# Patient Record
Sex: Female | Born: 1960 | Race: White | Hispanic: No | Marital: Married | Smoking: Never smoker
Health system: Southern US, Community
[De-identification: ages and names within clinical notes are randomized; demographics above are authoritative.]

## PROBLEM LIST (undated history)

## (undated) DIAGNOSIS — R112 Nausea with vomiting, unspecified: Secondary | ICD-10-CM

## (undated) DIAGNOSIS — I10 Essential (primary) hypertension: Secondary | ICD-10-CM

## (undated) DIAGNOSIS — N189 Chronic kidney disease, unspecified: Secondary | ICD-10-CM

## (undated) DIAGNOSIS — Z87442 Personal history of urinary calculi: Secondary | ICD-10-CM

## (undated) DIAGNOSIS — D649 Anemia, unspecified: Secondary | ICD-10-CM

## (undated) DIAGNOSIS — Z9889 Other specified postprocedural states: Secondary | ICD-10-CM

## (undated) HISTORY — PX: ECTOPIC PREGNANCY SURGERY: SHX613

## (undated) HISTORY — PX: OTHER SURGICAL HISTORY: SHX169

## (undated) HISTORY — PX: CYSTOSCOPY: SUR368

## (undated) HISTORY — PX: NEPHROLITHOTOMY: SUR881

---

## 2000-02-15 ENCOUNTER — Encounter (INDEPENDENT_AMBULATORY_CARE_PROVIDER_SITE_OTHER): Payer: Self-pay

## 2000-02-15 ENCOUNTER — Ambulatory Visit (HOSPITAL_COMMUNITY): Admission: RE | Admit: 2000-02-15 | Discharge: 2000-02-15 | Payer: Self-pay | Admitting: General Surgery

## 2004-01-26 ENCOUNTER — Ambulatory Visit (HOSPITAL_COMMUNITY): Admission: RE | Admit: 2004-01-26 | Discharge: 2004-01-26 | Payer: Self-pay | Admitting: Family Medicine

## 2005-06-01 ENCOUNTER — Ambulatory Visit (HOSPITAL_COMMUNITY): Admission: RE | Admit: 2005-06-01 | Discharge: 2005-06-01 | Payer: Self-pay | Admitting: Family Medicine

## 2006-01-27 ENCOUNTER — Ambulatory Visit (HOSPITAL_COMMUNITY): Admission: RE | Admit: 2006-01-27 | Discharge: 2006-01-27 | Payer: Self-pay | Admitting: Urology

## 2006-02-06 ENCOUNTER — Ambulatory Visit (HOSPITAL_COMMUNITY): Admission: RE | Admit: 2006-02-06 | Discharge: 2006-02-06 | Payer: Self-pay | Admitting: Urology

## 2006-02-13 ENCOUNTER — Ambulatory Visit (HOSPITAL_COMMUNITY): Admission: RE | Admit: 2006-02-13 | Discharge: 2006-02-13 | Payer: Self-pay | Admitting: Urology

## 2007-09-17 ENCOUNTER — Ambulatory Visit (HOSPITAL_COMMUNITY): Admission: RE | Admit: 2007-09-17 | Discharge: 2007-09-17 | Payer: Self-pay | Admitting: Family Medicine

## 2007-09-26 ENCOUNTER — Ambulatory Visit (HOSPITAL_COMMUNITY): Admission: RE | Admit: 2007-09-26 | Discharge: 2007-09-26 | Payer: Self-pay | Admitting: Family Medicine

## 2011-03-18 NOTE — Op Note (Signed)
NAMECRISSA, SOWDER              ACCOUNT NO.:  [id number]   MEDICAL RECORD NO.:  [id number]          PATIENT TYPE:  AMB   LOCATION:  DAY                           FACILITY:  [facility]   PHYSICIAN:  Dennie Maizes, M.D.   DATE OF BIRTH:  05-21-1961   DATE OF PROCEDURE:  02/13/2006  DATE OF DISCHARGE:                                 OPERATIVE REPORT   PREOPERATIVE DIAGNOSES:  1.  Left distal ureteral calculus with obstruction.  2.  Left renal colic.   POSTOPERATIVE DIAGNOSES:  1.  Left distal ureteral calculus with obstruction.  2.  Left renal colic.   OPERATIVE PROCEDURE:  Cystoscopy, left retrograde pyelogram, left  ureteroscopic stone extraction and left ureteral stent placement.   ANESTHESIA:  General.   SURGEON:  Dennie Maizes, M.D.   COMPLICATIONS:  None.   ESTIMATED BLOOD LOSS:  Minimal.   DRAINS:  A 6 Furey 26 cm size left ureteral stent with a string.   SPECIMEN:  Left ureteral calculus, which was sent for chemical analysis.   COMPLICATIONS:  None.   INDICATIONS FOR PROCEDURE:  This 50 year old female had severe left flank  pain.  X-rays revealed a 4 mm size left distal ureteral calculus with  obstruction and mild hydronephrosis.  The patient was unable to pass the  stone.  She is taken to the OR today for cystoscopy, left retrograde  pyelogram, left ureteroscopic stone extraction and left ureteral stent  placement.   DESCRIPTION OF PROCEDURE:  General anesthesia was induced and the patient  was placed on the OR table in the dorsal lithotomy position.  The lower  abdomen and genitalia were prepped and draped in a sterile fashion.  Cystoscopy was done with the 25-Rosevear scope.  The appearance of the bladder  was normal.  The trigone, ureteral orifices and bladder mucosa were  unremarkable.  A 5-Abdelrahman wedge catheter was then placed in the left lateral  orifice.  Over 7 mL of Renografin 60 was injected to the collecting system  and a retrograde pyelogram was done  by using C-arm fluoroscopy.  There was a  small filling defect in the intramural ureter.  There was mild dilation of  ureter on the left side.   A 5-Malachi open-ended catheter was then placed in the left ureteral orifice.  A 0.138 inches Benson guidewire was inserted into the left collecting  system.  The distal ureter was then dilated using a 15-Escamilla balloon  dilating catheter.  The balloon dilating catheter was then removed leaving  the guidewire in place.  Ureteroscopy was done with the 7.5 Saetern rigid  ureteroscope.  A stone was seen about 2 cm above the ureteral orifice.  The  stone was retrieved with a 11 mm Nitinol  0 tip wire basket without any  difficulty.  A 6 Calamari 26 cm size stent with a string was then inserted  into the left collecting system.  The instruments were removed.  The patient  was transferred to the PACU in a satisfactory condition.      Dennie Maizes, M.D.  Electronically Signed     SK/MEDQ  D:  02/13/2006  T:  02/13/2006  Job:  [id number]

## 2011-03-18 NOTE — H&P (Signed)
Tina Thompson, Tina Thompson              ACCOUNT NO.:  [id number]   MEDICAL RECORD NO.:  [id number]          PATIENT TYPE:  AMB   LOCATION:  DAY                           FACILITY:  [facility]   PHYSICIAN:  Dennie Maizes, M.D.   DATE OF BIRTH:  August 14, 1961   DATE OF ADMISSION:  02/13/2006  DATE OF DISCHARGE:  LH                                HISTORY & PHYSICAL   CHIEF COMPLAINT:  Independent left flank pain, nausea, left distal ureteral  calculus.   HISTORY OF PRESENT ILLNESS:  A 50 year old female who was referred to me by  Dr. Renard Matter.  She had severe intermittent left flank pain associated with  nausea in October of 2006.  A non-contrast CT scan of the abdomen done at  that time revealed a 3 mm sized stone in the left kidney in the uteropelvic  junction area.  The patient had pain relief at that time, and she did not  pass the stone.  She has been evaluated for recurrent flank pain at present.  The pain is very severe, pain scale of a 9/10.  She also had mild hematuria  and nausea.  She denied having any fever, chills or voiding difficulty.   PAST MEDICAL HISTORY:  1.  History of urolithiasis.  2.  Status post surgery for tubal pregnancy.  3.  Status post cesarean section x1.  4.  Type 2 diabetes mellitus.   MEDICATIONS:  Metformin 5 mg p.o. t.i.d.   ALLERGIES:  None.   FAMILY HISTORY:  Positive for type 2 diabetes mellitus.   PHYSICAL EXAMINATION:  VITAL SIGNS:  Height 5 feet 5 inches.  Weight 195  pounds.  HEENT:  Normal.  NECK:  No masses.  LUNGS:  Clear to auscultation.  HEART:  Regular rate and rhythm.  No murmurs.  ABDOMEN:  Soft, nonpalpable.  No palpable flank mass or costovertebral angle  tenderness.  Bladder is not palpable.  No suprapubic tenderness.   KUB revealed a 4 mm sized distal aorta calculus.   IMPRESSION:  Left distal aorta calculus, left renal colic, small non-  obstructing right renal calculus.   PLAN:  The patient has intermittent flank pain, and he is  unable to pass the  stone.  I have discussed with her regarding the management options.  She is  scheduled to undergo a cystoscopy, left retrograde pyelogram,  ureteroscopy, stone extraction and stent placement in the short stay center  at [facility] on February 13, 2006.  I have informed the patient  regarding the diagnosis, operative details, alternate treatments, the  outcome, possible risks and complications, and she has agreed for the  procedure to be done.      Dennie Maizes, M.D.  Electronically Signed     SK/MEDQ  D:  02/12/2006  T:  02/12/2006  Job:  [id number]   cc:   Angus G. Renard Matter, MD  Fax: [phone]

## 2012-07-05 ENCOUNTER — Ambulatory Visit (HOSPITAL_COMMUNITY)
Admission: RE | Admit: 2012-07-05 | Discharge: 2012-07-05 | Disposition: A | Discharge status: Home / Self Care | Payer: BC Managed Care – PPO | Source: Ambulatory Visit | Attending: Urology | Admitting: Urology

## 2012-07-05 ENCOUNTER — Other Ambulatory Visit (HOSPITAL_COMMUNITY): Payer: Self-pay | Admitting: Urology

## 2012-07-05 DIAGNOSIS — N200 Calculus of kidney: Secondary | ICD-10-CM

## 2012-07-12 ENCOUNTER — Encounter (HOSPITAL_COMMUNITY): Payer: Self-pay | Admitting: Pharmacy Technician

## 2012-07-13 NOTE — Patient Instructions (Addendum)
20 Tina Thompson  07/13/2012   Your procedure is scheduled on:  07/18/2012  Report to [facility] at 1130 AM.  Call this number if you have problems the morning of surgery: [phone]   Remember:   Do not eat food:After Midnight.  May have clear liquids:until Midnight .  Clear liquids include soda, tea, black coffee, apple or grape juice, broth.  Take these medicines the morning of surgery with A SIP OF WATER: altace   Do not wear jewelry, make-up or nail polish.  Do not wear lotions, powders, or perfumes. You may wear deodorant.  Do not shave 48 hours prior to surgery. Men may shave face and neck.  Do not bring valuables to the hospital.  Contacts, dentures or bridgework may not be worn into surgery.  Leave suitcase in the car. After surgery it may be brought to your room.  For patients admitted to the hospital, checkout time is 11:00 AM the day of discharge.   Patients discharged the day of surgery will not be allowed to drive home.  Name and phone number of your driver: family  Special Instructions: n/a   Please read over the following fact sheets that you were given: Pain Booklet, MRSA Information, Surgical Site Infection Prevention, Anesthesia Post-op Instructions and Care and Recovery After Surgery   PATIENT INSTRUCTIONS POST-ANESTHESIA  IMMEDIATELY FOLLOWING SURGERY:  Do not drive or operate machinery for the first twenty four hours after surgery.  Do not make any important decisions for twenty four hours after surgery or while taking narcotic pain medications or sedatives.  If you develop intractable nausea and vomiting or a severe headache please notify your doctor immediately.  FOLLOW-UP:  Please make an appointment with your surgeon as instructed. You do not need to follow up with anesthesia unless specifically instructed to do so.  WOUND CARE INSTRUCTIONS (if applicable):  Keep a dry clean dressing on the anesthesia/puncture wound site if there is drainage.  Once the  wound has quit draining you may leave it open to air.  Generally you should leave the bandage intact for twenty four hours unless there is drainage.  If the epidural site drains for more than 36-48 hours please call the anesthesia department.  QUESTIONS?:  Please feel free to call your physician or the hospital operator if you have any questions, and they will be happy to assist you.      Lithotripsy for Kidney Stones WHAT ARE KIDNEY STONES? The kidneys filter blood for chemicals the body cannot use. These waste chemicals are eliminated in the urine. They are removed from the body. Under some conditions, these chemicals may become concentrated. When this happens, they form crystals in the urine. When these crystals build up and stick together, stones may form. When these stones block the flow of urine through the urinary tract, they may cause severe pain. The urinary tract is very sensitive to blockage and stretching by the stone. WHAT IS LITHOTRIPSY? Lithotripsy is a treatment that can sometimes help eliminate kidney stones and pain faster. A form of lithotripsy, also known as ESWL (extracorporeal shock wave lithotripsy), is a nonsurgical procedure that helps your body rid itself of the kidney stone with a minimum amount of pain. EWSL is a method of crushing a kidney stone with shock waves. These shock waves pass through your body. They cause the kidney stones to crumble while still in the urinary tract. It is then easier for the smaller pieces of stone to pass in the urine.  Lithotripsy usually takes about an hour. It is done in a hospital, a lithotripsy center, or a mobile unit. It usually does not require an overnight stay. Your caregiver will instruct you on preparation for the procedure. Your caregiver will tell you what to expect afterward. LET YOUR CAREGIVER KNOW ABOUT:  Allergies.   Medicines taken including herbs, eye drops, over the counter medicines (including aspirin, aleve, or motrin for  treatment of inflammatory conditions) and creams.   Use of steroids (by mouth or creams).   Previous problems with anesthetics or novocaine.   Possibility of pregnancy, if this applies.   History of blood clots (thrombophlebitis).   History of bleeding or blood problems.   Previous surgery.   Other health problems.  RISKS AND COMPLICATIONS Complications of lithotripsy are uncommon, but include the following:  Infection.   Bleeding of the kidney.   Bruising of the kidney or skin.   Obstruction of the ureter (the passageway from the kidney to the bladder).   Failure of the stone to fragment (break apart).  PROCEDURE A stent (flexible tube with holes) may be placed in your ureter. The ureter is the tube that transports the urine from the kidneys to the bladder. Your caregiver may place a stent before the procedure. This will help keep urine flowing from the kidney if the fragments of the stone block the ureter. You may receive an intravenous (IV) line to give you fluids and medicines. These medicines may help you relax or make you sleep. During the procedure, you will lie comfortably on a fluid-filled cushion or in a warm-water bath. After an x-ray or ultrasound locates your stone, shock waves are aimed at the stone. If you are awake, you may feel a tapping sensation (feeling) as the shock waves pass through your body. If large stone particles remain after treatment, a second procedure may be necessary at a later date. For comfort during the test:  Relax as much as possible.   Try to remain still as much as possible.   Try to follow instructions to speed up the test.   Let your caregiver know if you are uncomfortable, anxious, or in pain.  AFTER THE PROCEDURE  After surgery, you will be taken to the recovery area. A nurse will watch and check your progress. Once you're awake, stable, and taking fluids well, you will be allowed to go home as long as there are no problems. You may  be prescribed antibiotics (medicines that kill germs) to help prevent infection. You may also be prescribed pain medicine if needed. In a week or two, your doctor may remove your stent, if you have one. Your caregiver will check to see whether or not stone particles remain. PASSING THE STONE It may take anywhere from a day to several weeks for the stone particles to leave your body. During this time, drink at least 8 to 12 eight ounce glasses of water every day. It is normal for your urine to be cloudy or slightly bloody for a few weeks following this procedure. You may even see small pieces of stone in your urine. A slight fever and some pain are also normal. Your caregiver may ask you to strain your urine to collect some stone particles for chemical analysis. If you find particles while straining the urine, save them. Analysis tells you and the caregiver what the stone is made of. Knowing this may help prevent future stones. PREVENTING FUTURE STONES  Drink about 8 to 12, eight-ounce glasses  of water every day.   Follow the diet your caregiver recommends.   Take your prescribed medicine.   See your caregiver regularly for checkups.  SEEK IMMEDIATE MEDICAL CARE IF:  You develop an oral temperature above 102 F (38.9 C), or as your caregiver suggests.   Your pain is not relieved by medicine.   You develop nausea (feeling sick to your stomach) and vomiting.   You develop heavy bleeding.   You have difficulty urinating.  Document Released: 10/14/2000 Document Revised: 10/06/2011 Document Reviewed: 08/08/2008 [facility] Patient Information 2012 [place], [place].

## 2012-07-16 ENCOUNTER — Encounter (HOSPITAL_COMMUNITY): Payer: Self-pay

## 2012-07-16 ENCOUNTER — Encounter (HOSPITAL_COMMUNITY)
Admission: RE | Admit: 2012-07-16 | Discharge: 2012-07-16 | Disposition: A | Discharge status: Home / Self Care | Payer: BC Managed Care – PPO | Source: Ambulatory Visit | Attending: Urology | Admitting: Urology

## 2012-07-16 ENCOUNTER — Other Ambulatory Visit: Payer: Self-pay

## 2012-07-16 HISTORY — DX: Anemia, unspecified: D64.9

## 2012-07-16 HISTORY — DX: Chronic kidney disease, unspecified: N18.9

## 2012-07-16 HISTORY — DX: Essential (primary) hypertension: I10

## 2012-07-16 LAB — BASIC METABOLIC PANEL
BUN: 17 mg/dL (ref 6–23)
Sodium: 137 mEq/L (ref 135–145)

## 2012-07-18 ENCOUNTER — Ambulatory Visit (HOSPITAL_COMMUNITY)
Admission: RE | Admit: 2012-07-18 | Discharge: 2012-07-18 | Disposition: A | Discharge status: Home / Self Care | Payer: BC Managed Care – PPO | Source: Ambulatory Visit | Attending: Urology | Admitting: Urology

## 2012-07-18 ENCOUNTER — Encounter (HOSPITAL_COMMUNITY)
Admission: RE | Disposition: A | Discharge status: Home / Self Care | Payer: Self-pay | Source: Ambulatory Visit | Attending: Urology

## 2012-07-18 ENCOUNTER — Ambulatory Visit (HOSPITAL_COMMUNITY): Payer: BC Managed Care – PPO

## 2012-07-18 ENCOUNTER — Encounter (HOSPITAL_COMMUNITY): Payer: Self-pay | Admitting: *Deleted

## 2012-07-18 DIAGNOSIS — N200 Calculus of kidney: Secondary | ICD-10-CM | POA: Insufficient documentation

## 2012-07-18 DIAGNOSIS — Z01818 Encounter for other preprocedural examination: Secondary | ICD-10-CM | POA: Insufficient documentation

## 2012-07-18 DIAGNOSIS — R1031 Right lower quadrant pain: Secondary | ICD-10-CM | POA: Insufficient documentation

## 2012-07-18 DIAGNOSIS — Z01810 Encounter for preprocedural cardiovascular examination: Secondary | ICD-10-CM | POA: Insufficient documentation

## 2012-07-18 DIAGNOSIS — E119 Type 2 diabetes mellitus without complications: Secondary | ICD-10-CM | POA: Insufficient documentation

## 2012-07-18 HISTORY — PX: EXTRACORPOREAL SHOCK WAVE LITHOTRIPSY: SHX1557

## 2012-07-18 LAB — GLUCOSE, CAPILLARY: Glucose-Capillary: 112 mg/dL — ABNORMAL HIGH (ref 70–99)

## 2012-07-18 SURGERY — LITHOTRIPSY, ESWL
Anesthesia: Moderate Sedation | Laterality: Right

## 2012-07-18 MED ORDER — SODIUM CHLORIDE 0.45 % IV SOLN
Freq: Once | INTRAVENOUS | Status: AC
  Administered 2012-07-18: 1000 mL via INTRAVENOUS

## 2012-07-18 MED ORDER — DIAZEPAM 5 MG PO TABS
ORAL_TABLET | ORAL | Status: AC
  Filled 2012-07-18: qty 2

## 2012-07-18 MED ORDER — DIPHENHYDRAMINE HCL 25 MG PO CAPS
25.0000 mg | ORAL_CAPSULE | Freq: Once | ORAL | Status: AC
  Administered 2012-07-18: 25 mg via ORAL

## 2012-07-18 MED ORDER — DIPHENHYDRAMINE HCL 25 MG PO CAPS
ORAL_CAPSULE | ORAL | Status: AC
  Filled 2012-07-18: qty 1

## 2012-07-18 MED ORDER — DIAZEPAM 5 MG PO TABS
10.0000 mg | ORAL_TABLET | Freq: Once | ORAL | Status: AC
  Administered 2012-07-18: 10 mg via ORAL

## 2012-07-18 SURGICAL SUPPLY — 3 items
CLOTH BEACON ORANGE TIMEOUT ST (SAFETY) IMPLANT
GOWN STRL REIN XL XLG (GOWN DISPOSABLE) IMPLANT
TOWEL OR 17X26 4PK STRL BLUE (TOWEL DISPOSABLE) IMPLANT

## 2012-07-18 NOTE — H&P (Signed)
Tina Thompson, Tina Thompson              ACCOUNT NO.:  [id number]  MEDICAL RECORD NO.:  [id number]  LOCATION:  [facility]                          FACILITY:  [facility]  PHYSICIAN:  Ky Barban, M.D.DATE OF BIRTH:  June 30, 1961  DATE OF ADMISSION:  07/18/2012 DATE OF DISCHARGE:  LH                             HISTORY & PHYSICAL   CHIEF COMPLAINT:  Right renal calculus, right flank pain.  HISTORY:  She is a 51 year old female, patient of Dr. Rito Ehrlich, had left ureteral calculus removed with a left ureteroscopic stone basket, 2007. She had a small 3 mm calculus in the right kidney causing no obstruction.  Now, she is having pain on that side, and did a KUB shows large at least 3 cm stone in the right kidney causing no obstruction, and it was confirmed with CT scan, causing no obstruction.  No fever, chills, or gross hematuria.  So, I have advised her to undergo procedure of ESL with its complication especially need for additional procedure, and no guarantee that we will be able to get the stones out is recommended.  She is coming as an outpatient to undergo ESL for right renal calculus.  PAST MEDICAL HISTORY:  History of kidney stones, status post surgery for tubal para pregnancy, cesarean section x1, type 2 diabetes.  MEDICATIONS:  She is taking Glucophage.  ALLERGIES:  None.  FAMILY HISTORY:  Positive for diabetes.  PHYSICAL EXAMINATION:  GENERAL:  Moderately built female, not in acute distress, fully conscious, alert, oriented. VITAL SIGNS:  Blood pressure 124/72, temperature is 98.7. CENTRAL NERVOUS SYSTEM:  No gross neurological deficit. HEAD, NECK, EYES, ENT:  Negative. CHEST:  Symmetrical. HEART:  Regular sinus rhythm.  Normal. LUNGS:  Clear to auscultation. ABDOMEN:  Soft, flat.  Liver, spleen, and kidneys are not palpable.  1+ right CVA tenderness. PELVIC:  Not done.  IMPRESSION: 1. Right renal calculus. 2. Diabetes, non-insulin.  PLAN:  ESL, right renal calculus as an  outpatient.     Ky Barban, M.D.     MIJ/MEDQ  D:  07/18/2012  T:  07/18/2012  Job:  [id number]

## 2012-07-20 ENCOUNTER — Encounter (HOSPITAL_COMMUNITY): Payer: Self-pay | Admitting: Urology

## 2012-07-25 ENCOUNTER — Ambulatory Visit (HOSPITAL_COMMUNITY)
Admission: RE | Admit: 2012-07-25 | Discharge: 2012-07-25 | Disposition: A | Discharge status: Home / Self Care | Payer: BC Managed Care – PPO | Source: Ambulatory Visit | Attending: Urology | Admitting: Urology

## 2012-07-25 ENCOUNTER — Other Ambulatory Visit (HOSPITAL_COMMUNITY): Payer: Self-pay | Admitting: Urology

## 2012-07-25 DIAGNOSIS — N200 Calculus of kidney: Secondary | ICD-10-CM

## 2012-08-08 ENCOUNTER — Inpatient Hospital Stay (HOSPITAL_COMMUNITY): Admission: RE | Admit: 2012-08-08 | Payer: BC Managed Care – PPO | Source: Ambulatory Visit

## 2012-08-15 ENCOUNTER — Ambulatory Visit (HOSPITAL_COMMUNITY): Admission: RE | Admit: 2012-08-15 | Payer: BC Managed Care – PPO | Source: Ambulatory Visit

## 2012-08-15 ENCOUNTER — Encounter (HOSPITAL_COMMUNITY): Admission: RE | Payer: Self-pay | Source: Ambulatory Visit

## 2012-08-15 SURGERY — LITHOTRIPSY, ESWL
Anesthesia: Moderate Sedation | Laterality: Right

## 2014-01-06 IMAGING — CR DG ABDOMEN 1V
2 series · 2 of 2 positions shown · non-contrast
Comparison: 02/13/2006 and 02/06/2006

***ADDENDUM*** CREATED: 07/05/2012 [DATE]

These results will be called to the ordering clinician or
representative by the Radiologist Assistant, and communication
documented in the PACS Dashboard.
CLINICAL DATA: Right renal calculus.  Right-sided abdominal pain
for 2 weeks
ABDOMEN - 1 VIEW

[view not recorded (1 of 2)]
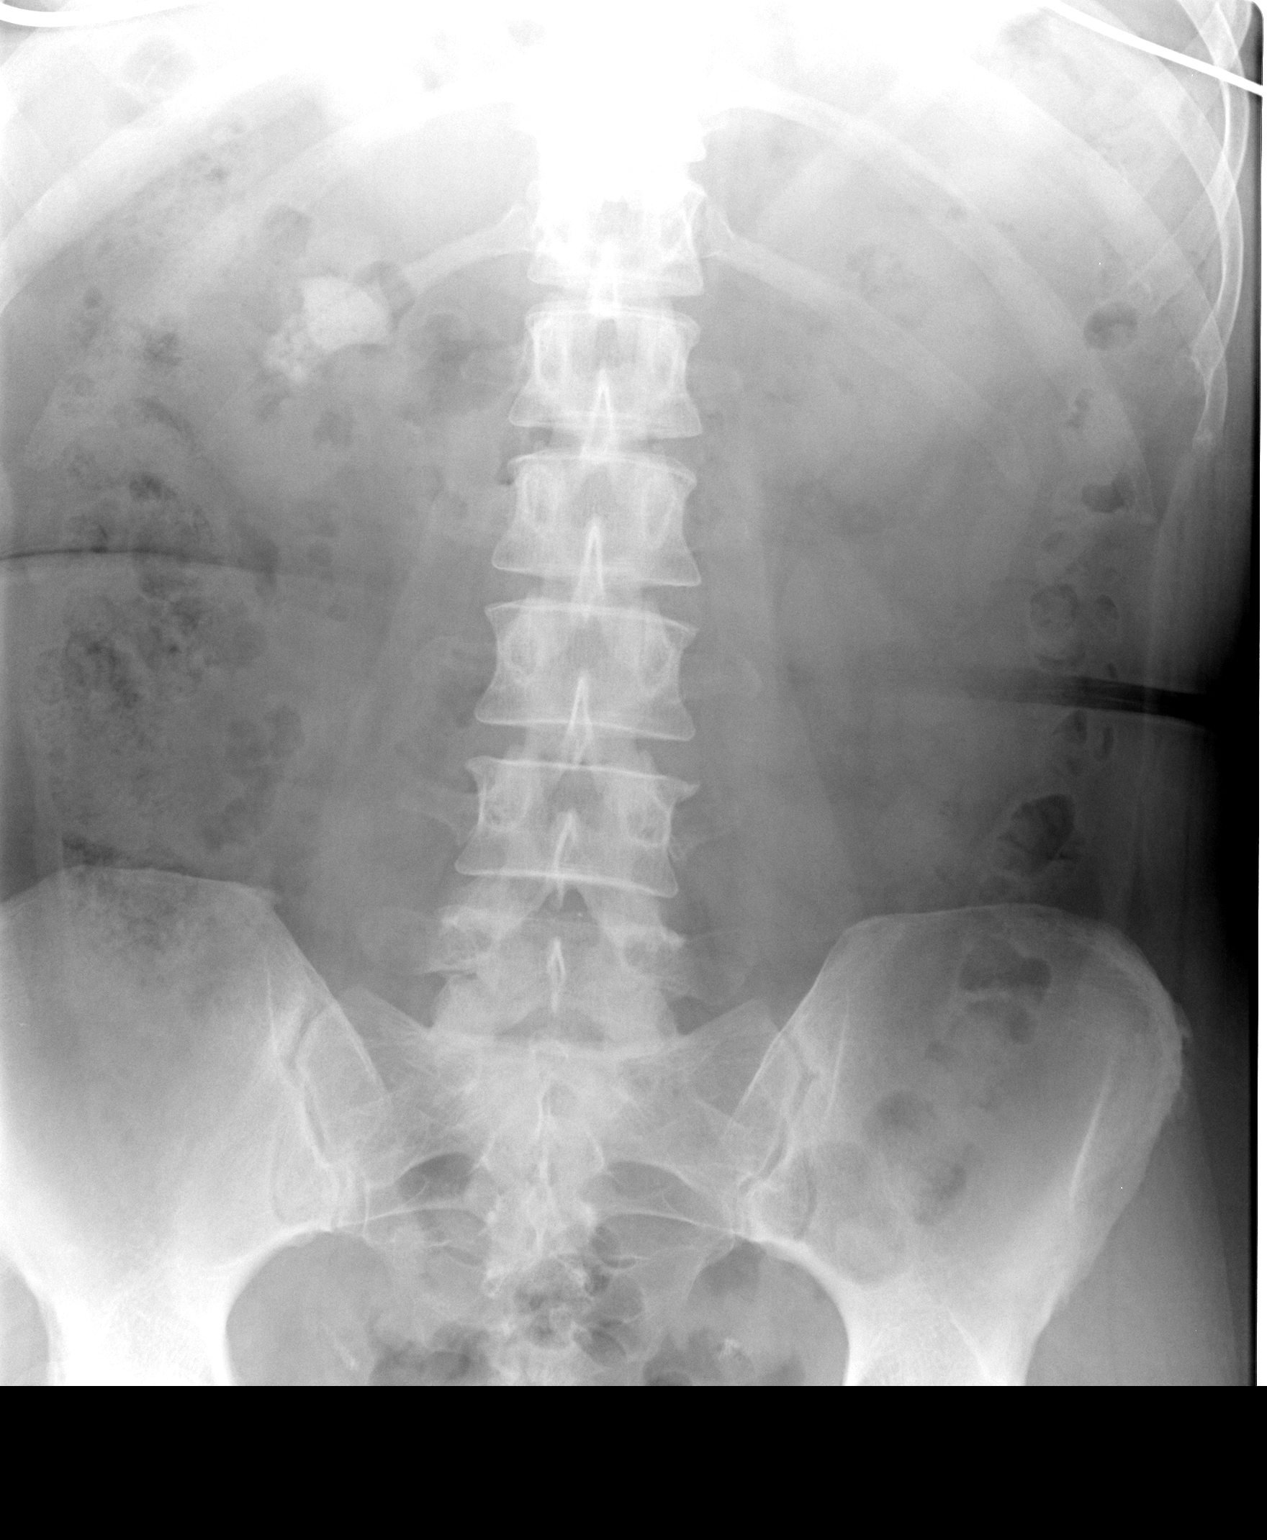

[view not recorded (2 of 2)]
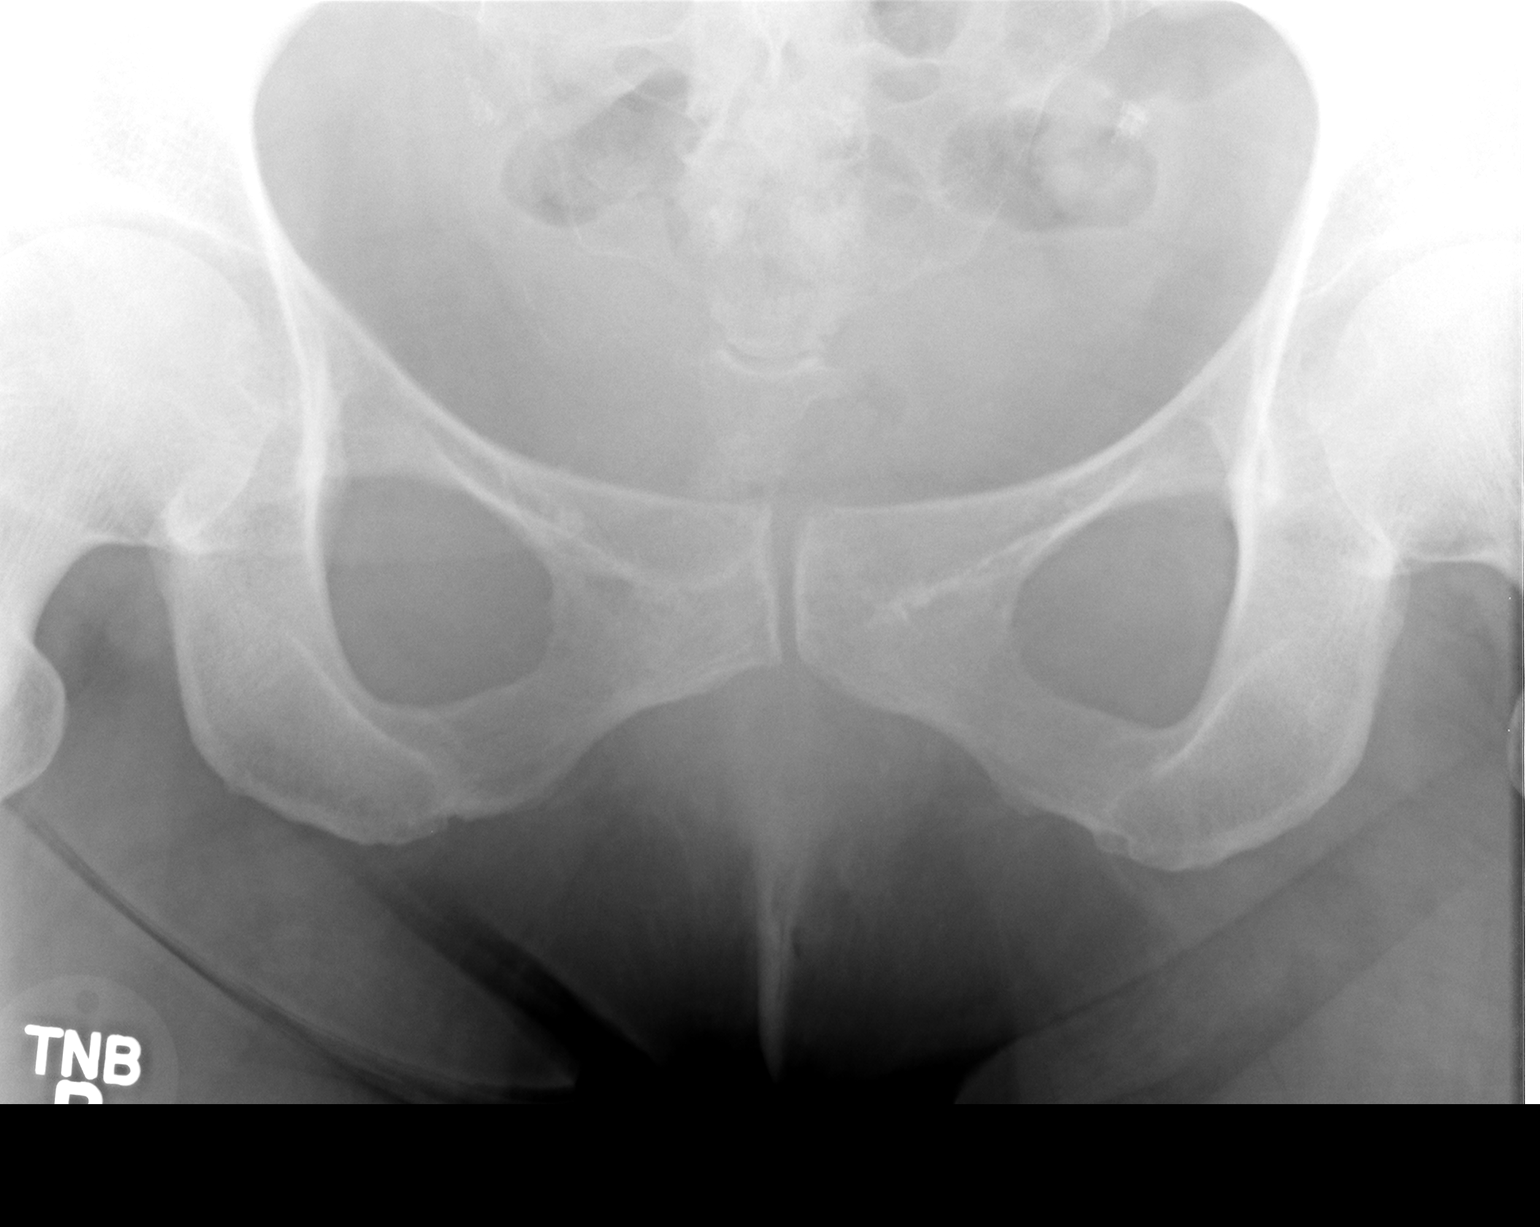

[2 of 2 positions shown; findings below may reference images not displayed]

FINDINGS: Large calculi are identified within the right collecting
system one overlying the lower pole measuring 2.7 x 4.6 cm and one
overlying the upper pole measuring 3.5 x 3.3 cm. The appearance is
suspicious for a developing staghorn calculus in the right kidney.
No definite renal calculi are seen on the left.

There is a subtle 5 mm density adjacent to the L1-L2 vertebral
level measuring 5 mm and this could represent a proximal ureteral
calculus.

In the pelvis adjacent to both sides of the sacrum areas of
irregular increased density are noted.  These were seen on the
prior exam from 02/06/2006 suggesting an extra ureteral etiology,
possibly vascular or related to prior tubal ligation, if
applicable.

Bony structures appear intact
IMPRESSION: Findings suspicious for developing right staghorn calculus.  Subtle
density in the region of the proximal right ureter may represent a
ureteral stone.  Further evaluation with CT may be helpful for
complete assessment.

## 2017-06-23 ENCOUNTER — Other Ambulatory Visit (HOSPITAL_COMMUNITY): Payer: Self-pay | Admitting: Internal Medicine

## 2017-06-23 DIAGNOSIS — R1310 Dysphagia, unspecified: Secondary | ICD-10-CM

## 2017-06-23 DIAGNOSIS — R221 Localized swelling, mass and lump, neck: Secondary | ICD-10-CM

## 2017-06-23 DIAGNOSIS — E041 Nontoxic single thyroid nodule: Secondary | ICD-10-CM

## 2017-06-27 ENCOUNTER — Ambulatory Visit (HOSPITAL_COMMUNITY)
Admission: RE | Admit: 2017-06-27 | Discharge: 2017-06-27 | Disposition: A | Discharge status: Home / Self Care | Payer: 59 | Source: Ambulatory Visit | Attending: Internal Medicine | Admitting: Internal Medicine

## 2017-06-27 DIAGNOSIS — R221 Localized swelling, mass and lump, neck: Secondary | ICD-10-CM | POA: Diagnosis not present

## 2017-06-27 DIAGNOSIS — E041 Nontoxic single thyroid nodule: Secondary | ICD-10-CM | POA: Diagnosis not present

## 2017-06-27 DIAGNOSIS — R1310 Dysphagia, unspecified: Secondary | ICD-10-CM

## 2019-12-26 ENCOUNTER — Other Ambulatory Visit: Payer: Self-pay | Admitting: Obstetrics and Gynecology

## 2019-12-26 DIAGNOSIS — E041 Nontoxic single thyroid nodule: Secondary | ICD-10-CM

## 2019-12-30 ENCOUNTER — Other Ambulatory Visit: Payer: 59

## 2019-12-30 ENCOUNTER — Ambulatory Visit
Admission: RE | Admit: 2019-12-30 | Discharge: 2019-12-30 | Disposition: A | Discharge status: Home / Self Care | Payer: 59 | Source: Ambulatory Visit | Attending: Obstetrics and Gynecology | Admitting: Obstetrics and Gynecology

## 2019-12-30 DIAGNOSIS — E041 Nontoxic single thyroid nodule: Secondary | ICD-10-CM

## 2020-04-07 ENCOUNTER — Other Ambulatory Visit: Payer: Self-pay

## 2020-04-07 ENCOUNTER — Ambulatory Visit
Admission: EM | Admit: 2020-04-07 | Discharge: 2020-04-07 | Disposition: A | Discharge status: Home / Self Care | Payer: 59

## 2020-04-07 NOTE — ED Notes (Signed)
Pt with symptoms of kidney stone, explained available resources to patient (urinalysis but not CT), patient decided to go to ER for evaluation. Stable upon departure.

## 2020-04-08 ENCOUNTER — Encounter (HOSPITAL_COMMUNITY): Payer: Self-pay | Admitting: Emergency Medicine

## 2020-04-08 ENCOUNTER — Emergency Department (HOSPITAL_COMMUNITY): Payer: 59

## 2020-04-08 ENCOUNTER — Emergency Department (HOSPITAL_COMMUNITY)
Admission: EM | Admit: 2020-04-08 | Discharge: 2020-04-09 | Disposition: A | Discharge status: Home / Self Care | Payer: 59 | Attending: Emergency Medicine | Admitting: Emergency Medicine

## 2020-04-08 ENCOUNTER — Other Ambulatory Visit: Payer: Self-pay

## 2020-04-08 DIAGNOSIS — I10 Essential (primary) hypertension: Secondary | ICD-10-CM | POA: Diagnosis not present

## 2020-04-08 DIAGNOSIS — R110 Nausea: Secondary | ICD-10-CM | POA: Insufficient documentation

## 2020-04-08 DIAGNOSIS — E119 Type 2 diabetes mellitus without complications: Secondary | ICD-10-CM | POA: Insufficient documentation

## 2020-04-08 DIAGNOSIS — Z79899 Other long term (current) drug therapy: Secondary | ICD-10-CM | POA: Insufficient documentation

## 2020-04-08 DIAGNOSIS — Z7984 Long term (current) use of oral hypoglycemic drugs: Secondary | ICD-10-CM | POA: Insufficient documentation

## 2020-04-08 DIAGNOSIS — Z87442 Personal history of urinary calculi: Secondary | ICD-10-CM | POA: Diagnosis not present

## 2020-04-08 DIAGNOSIS — R1011 Right upper quadrant pain: Secondary | ICD-10-CM | POA: Diagnosis not present

## 2020-04-08 DIAGNOSIS — R197 Diarrhea, unspecified: Secondary | ICD-10-CM | POA: Insufficient documentation

## 2020-04-08 DIAGNOSIS — R1031 Right lower quadrant pain: Secondary | ICD-10-CM | POA: Insufficient documentation

## 2020-04-08 DIAGNOSIS — R109 Unspecified abdominal pain: Secondary | ICD-10-CM

## 2020-04-08 LAB — COMPREHENSIVE METABOLIC PANEL
ALT: 15 U/L (ref 0–44)
AST: 20 U/L (ref 15–41)
Albumin: 4.5 g/dL (ref 3.5–5.0)
Alkaline Phosphatase: 72 U/L (ref 38–126)
Anion gap: 12 (ref 5–15)
BUN: 16 mg/dL (ref 6–20)
CO2: 22 mmol/L (ref 22–32)
Calcium: 9.5 mg/dL (ref 8.9–10.3)
Chloride: 106 mmol/L (ref 98–111)
Creatinine, Ser: 1.14 mg/dL — ABNORMAL HIGH (ref 0.44–1.00)
GFR calc Af Amer: >60 mL/min (ref >60)
GFR calc non Af Amer: 54 mL/min — ABNORMAL LOW (ref >60)
Glucose, Bld: 132 mg/dL — ABNORMAL HIGH (ref 70–99)
Potassium: 4 mmol/L (ref 3.5–5.1)
Sodium: 140 mmol/L (ref 135–145)
Total Bilirubin: 0.6 mg/dL (ref 0.3–1.2)
Total Protein: 7.2 g/dL (ref 6.5–8.1)

## 2020-04-08 LAB — URINALYSIS, ROUTINE W REFLEX MICROSCOPIC
Bilirubin Urine: NEGATIVE
Glucose, UA: NEGATIVE mg/dL
Hgb urine dipstick: NEGATIVE
Ketones, ur: NEGATIVE mg/dL
Leukocytes,Ua: NEGATIVE
Nitrite: NEGATIVE
Protein, ur: NEGATIVE mg/dL
Specific Gravity, Urine: 1.023 (ref 1.005–1.030)
pH: 5 (ref 5.0–8.0)

## 2020-04-08 LAB — CBC
HCT: 43.1 % (ref 36.0–46.0)
Hemoglobin: 13.5 g/dL (ref 12.0–15.0)
MCH: 26.4 pg (ref 26.0–34.0)
MCHC: 31.3 g/dL (ref 30.0–36.0)
MCV: 84.2 fL (ref 80.0–100.0)
Platelets: 288 10*3/uL (ref 150–400)
RBC: 5.12 MIL/uL — ABNORMAL HIGH (ref 3.87–5.11)
RDW: 14.1 % (ref 11.5–15.5)
WBC: 7.4 10*3/uL (ref 4.0–10.5)
nRBC: 0 % (ref 0.0–0.2)

## 2020-04-08 LAB — LIPASE, BLOOD: Lipase: 53 U/L — ABNORMAL HIGH (ref 11–51)

## 2020-04-08 MED ORDER — MORPHINE SULFATE (PF) 4 MG/ML IV SOLN
4.0000 mg | Freq: Once | INTRAVENOUS | Status: DC
  Filled 2020-04-08: qty 1

## 2020-04-08 MED ORDER — ONDANSETRON HCL 4 MG/2ML IJ SOLN
4.0000 mg | Freq: Once | INTRAMUSCULAR | Status: DC
  Filled 2020-04-08: qty 2

## 2020-04-08 MED ORDER — SODIUM CHLORIDE 0.9% FLUSH: 3.0000 mL | Freq: Once | INTRAVENOUS | Status: DC

## 2020-04-08 MED ORDER — SODIUM CHLORIDE 0.9 % IV BOLUS
1000.0000 mL | Freq: Once | INTRAVENOUS | Status: AC
  Administered 2020-04-08: 1000 mL via INTRAVENOUS

## 2020-04-08 NOTE — ED Provider Notes (Signed)
[facility] EMERGENCY DEPARTMENT Provider Note   CSN: [id number] Arrival date & time: 04/08/20  1544     History Chief Complaint  Patient presents with  . Abdominal Pain    Tina Thompson is a 59 y.o. female with a history of diabetes mellitus, hypertension, nephrolithiasis, anemia, and prior abdominal surgeries including c-section, ectopic pregnancy surgical intervention & lithotripsy who presents to the ED with complaints of abdominal pain x 4 days. Patient states there is a aching pain to the R flank with radiation of pain to the R mid abdomen with intermittent episodes of increased sharp pain to the R mid abdomen. Increased pain episodes occur a few times per day without specific triggers or alleviating/aggravating factors. She has had associated nausea, decreased appetite & episodes of loose stools. She denies fever, chills, emesis, melena, hematochezia, dysuria, hematuria, vaginal discharge/bleeding, chest pain, or dyspnea.  Denies recent travel or abx. She has a history of kidney stones and states this has some similar qualities but also feels different.    HPI     Past Medical History:  Diagnosis Date  . Anemia   . Chronic kidney disease    kidney stones  . Diabetes mellitus   . Hypertension     There are no problems to display for this patient.   Past Surgical History:  Procedure Laterality Date  . CESAREAN SECTION    . CYSTOSCOPY     w/ right kidney stone removed  . ECTOPIC PREGNANCY SURGERY     w/ removal of left tube  . EXTRACORPOREAL SHOCK WAVE LITHOTRIPSY  07/18/2012   Procedure: EXTRACORPOREAL SHOCK WAVE LITHOTRIPSY (ESWL);  Surgeon: Ky Barban, MD;  Location: [facility];  Service: Urology;  Laterality: Right;  ESWL Right Renal Calculus  . hemorhoidectomy       OB History   No obstetric history on file.     No family history on file.  Social History   Tobacco Use  . Smoking status: Never Smoker  Substance Use Topics  .  Alcohol use: No  . Drug use: No    Home Medications Prior to Admission medications   Medication Sig Start Date End Date Taking? Authorizing Provider  metFORMIN (GLUCOPHAGE) 500 MG tablet Take 500 mg by mouth 2 (two) times daily with a meal.   Yes [provider]  ramipril (ALTACE) 5 MG capsule Take 5 mg by mouth 2 (two) times daily.    Yes [provider]  zolpidem (AMBIEN) 10 MG tablet Take 10 mg by mouth at bedtime as needed for sleep.  03/12/20  Yes [provider]  benzonatate (TESSALON) 100 MG capsule Take 100-200 mg by mouth every 8 (eight) hours as needed for cough. 02/03/20   [provider]  exenatide (BYETTA) 10 MCG/0.04ML SOLN Inject 10 mcg into the skin 2 (two) times daily with a meal.    [provider]  hydrochlorothiazide (HYDRODIURIL) 25 MG tablet Take 25 mg by mouth daily.    [provider]  Vitamin D, Ergocalciferol, (DRISDOL) 50000 UNITS CAPS Take 50,000 Units by mouth every 7 (seven) days. Takes on Wednesday.    [provider]    Allergies    Patient has no known allergies.  Review of Systems   Review of Systems  Constitutional: Negative for chills and fever.  Respiratory: Negative for cough and shortness of breath.   Cardiovascular: Negative for chest pain.  Gastrointestinal: Positive for abdominal pain, diarrhea and nausea. Negative for anal bleeding,  blood in stool, constipation and vomiting.  Genitourinary: Positive for flank pain. Negative for dysuria, hematuria, pelvic pain, vaginal bleeding and vaginal discharge.  Neurological: Negative for syncope.  All other systems reviewed and are negative.   Physical Exam Updated Vital Signs BP (!) 155/78   Pulse 81   Temp 98.1 F (36.7 C) (Oral)   Resp 19   Ht 5\' 5"  (1.651 m)   Wt 83.9 kg   LMP 07/05/2012   SpO2 100%   BMI 30.79 kg/m   Physical Exam Vitals and nursing note reviewed.  Constitutional:      General: She is not in acute  distress.    Appearance: She is well-developed. She is not toxic-appearing.  HENT:     Head: Normocephalic and atraumatic.  Eyes:     General:        Right eye: No discharge.        Left eye: No discharge.     Conjunctiva/sclera: Conjunctivae normal.  Cardiovascular:     Rate and Rhythm: Normal rate and regular rhythm.  Pulmonary:     Effort: Pulmonary effort is normal. No respiratory distress.     Breath sounds: Normal breath sounds. No wheezing, rhonchi or rales.  Abdominal:     General: There is no distension.     Palpations: Abdomen is soft.     Tenderness: There is abdominal tenderness in the right upper quadrant and right lower quadrant. There is no right CVA tenderness, left CVA tenderness, guarding or rebound. Negative signs include Murphy's sign and Rovsing's sign.  Musculoskeletal:     Cervical back: Neck supple.  Skin:    General: Skin is warm and dry.     Findings: No rash.  Neurological:     Mental Status: She is alert.     Comments: Clear speech.   Psychiatric:        Behavior: Behavior normal.    ED Results / Procedures / Treatments   Labs (all labs ordered are listed, but only abnormal results are displayed) Labs Reviewed  LIPASE, BLOOD - Abnormal; Notable for the following components:      Result Value   Lipase 53 (*)    All other components within normal limits  COMPREHENSIVE METABOLIC PANEL - Abnormal; Notable for the following components:   Glucose, Bld 132 (*)    Creatinine, Ser 1.14 (*)    GFR calc non Af Amer 54 (*)    All other components within normal limits  CBC - Abnormal; Notable for the following components:   RBC 5.12 (*)    All other components within normal limits  URINALYSIS, ROUTINE W REFLEX MICROSCOPIC    EKG None  Radiology CT Abdomen Pelvis W Contrast  Result Date: 04/09/2020 CLINICAL DATA:  Abdominal pain radiating to right flank EXAM: CT ABDOMEN AND PELVIS WITH CONTRAST TECHNIQUE: Multidetector CT imaging of the abdomen and  pelvis was performed using the standard protocol following bolus administration of intravenous contrast. CONTRAST:  159mL OMNIPAQUE IOHEXOL 300 MG/ML  SOLN COMPARISON:  Abdominal radiograph 07/25/2012 FINDINGS: Lower chest: Lung bases are clear. Normal heart size. No pericardial effusion. Hepatobiliary: No worrisome focal liver lesions. Smooth liver surface contour. Normal hepatic attenuation. Small 4 mm calcified gallstone situated in the proximal cystic duct/gallbladder neck (3/24), without significant gallbladder distention, pericholecystic fluid or inflammation. No other visible calcified intraductal gallstones. No biliary ductal dilatation. Pancreas: Unremarkable. No pancreatic ductal dilatation or surrounding inflammatory changes. Spleen: Normal in size without focal abnormality. Adrenals/Urinary Tract: Normal adrenal  glands. Kidneys are normally located with symmetric enhancement and excretion. Bilateral areas of cortical scarring. Previously seen right renal calculi, no longer evident. No suspicious renal lesion, new urolithiasis or hydronephrosis. Urinary bladder is unremarkable. Stomach/Bowel: Distal esophagus and stomach are unremarkable. Small air and debris filled duodenal diverticulum measuring 2.1 cm (3/37). No inflammation. No small bowel thickening or dilatation. No evidence of mechanical obstruction. Insert normal appendix No colonic dilatation or wall thickening. Vascular/Lymphatic: No significant vascular findings are present. No enlarged abdominal or pelvic lymph nodes. Reproductive: Anteverted uterus. No concerning adnexal lesions. Surgical material in the bilateral adnexa. Other: No abdominopelvic free fluid or free gas. No bowel containing hernias. Musculoskeletal: Multilevel degenerative changes are present in the imaged portions of the spine. Probable hemangioma in the L4 vertebrae. No acute osseous abnormality or suspicious osseous lesion. IMPRESSION: 1. 4 mm calcified gallstone situated  in the proximal cystic duct/gallbladder neck without significant gallbladder distention, pericholecystic fluid or inflammation. If there is clinical concern for acute cholecystitis, consider further evaluation with right upper quadrant ultrasound. 2. Previously seen right renal calculi, no longer evident. Bilateral areas of cortical scarring. No urolithiasis or obstructive nephropathy. 3. 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 Electronically Signed   By: Kreg Shropshire M.D.   On: 04/09/2020 00:10   US Abdomen Limited RUQ  Result Date: 04/09/2020 CLINICAL DATA:  Right upper quadrant abdominal pain x1 week EXAM: ULTRASOUND ABDOMEN LIMITED RIGHT UPPER QUADRANT COMPARISON:  CT dated April 09, 2020 FINDINGS: Gallbladder: The probable stone visualized on the patient's prior CT is not well visualized on this study. There is no significant gallbladder wall thickening. The sonographic Eulah Pont sign is negative. Common bile duct: Diameter: 6 mm Liver: Diffuse increased echogenicity with slightly heterogeneous liver. Appearance typically secondary to fatty infiltration. Fibrosis secondary consideration. No secondary findings of cirrhosis noted. No focal hepatic lesion or intrahepatic biliary duct dilatation. Portal vein is patent on color Doppler imaging with normal direction of blood flow towards the liver. Other: None. IMPRESSION: 1. No evidence for acute cholecystitis. The stone visualized on the patient's prior CT is not well appreciated on this study. 2. Hepatic steatosis. Electronically Signed   By: Katherine Mantle M.D.   On: 04/09/2020 02:51    Procedures Procedures (including critical care time)  Medications Ordered in ED Medications  sodium chloride flush (NS) 0.9 % injection 3 mL (has no administration in time range)    ED Course  I have reviewed the triage vital signs and the nursing notes.  Pertinent labs & imaging results that were available during my care of the  patient were reviewed by me and considered in my medical decision making (see chart for details).    MDM Rules/Calculators/A&P                     Patient presents to the ED with complaints of abdominal pain for the past 4 days. She is nontoxic, resting comfortably, vitals WNL with the exception of elevated BP low suspicion for HTN emergency. On exam she has RUQ/RLQ tenderness without peritoneal signs.   Ddx: Nephrolithiasis, pyelonephritis, cholelithiasis, cholecystitis, cholangitis, appendicitis, perf, obstruction, colitis, diverticulitis, viral GI illness.   Additional history obtained:  Additional history obtained from chart review & nursing note review.   Lab Tests:  I Ordered, reviewed, and interpreted labs, which included:  CBC: No significant anemia or leukocytosis  CMP: Mildly elevated creatinine- similar to prior labs on record when reviewed.  Lipase: Minimal elevation UA: No  UTI.   Imaging Studies ordered:  I ordered imaging studies which included CT A/P & subsequently RUQ [place], I independently visualized and interpreted imaging.   CT A/P: 1. 4 mm calcified gallstone situated in the proximal cystic duct/gallbladder neck without significant gallbladder distention, pericholecystic fluid or inflammation. If there is clinical concern for acute cholecystitis, consider further evaluation with right upper quadrant ultrasound. 2. Previously seen right renal calculi, no longer evident. Bilateral areas of cortical scarring. No urolithiasis or obstructive nephropathy. 3. Small air and debris filled duodenal diverticulum without inflammation  RUQ [place]: 1. No evidence for acute cholecystitis. The stone visualized on the patient's prior CT is not well appreciated on this study. 2. Hepatic steatosis.  ED Course:  22:40: Initial evaluation of patient- I have ordered morphine for pain, zofran for nausea, and fluids for hydration.   00:22: RE-EVAL: Patient initially decided to hold off on pain  medicines, remains uncomfortable with RUQ tenderness, will proceed with RUQ [place].   Overall reassuring labs & imaging in the ED.  No signs of acute surgical abdomen.   03:25: RE-EVAL: Patient is feeling better, tolerating PO, would like to go home at this time. Possibly symptomatic cholelithiasis, will provide prescription for analgesics & anti-emetics with general surgery follow up. I discussed results, treatment plan, need for follow-up, and return precautions with the patient & her husband @ bedside. Provided opportunity for questions, patient & her husband confirmed understanding and are in agreement with plan.   Portions of this note were generated with Scientist, clinical (histocompatibility and immunogenetics). Dictation errors may occur despite best attempts at proofreading.  Final Clinical Impression(s) / ED Diagnoses Final diagnoses:  Abdominal pain    Rx / DC Orders ED Discharge Orders         Ordered    HYDROcodone-acetaminophen (NORCO/VICODIN) 5-325 MG tablet  Every 6 hours PRN     Discontinue  Reprint     04/09/20 0330    ondansetron (ZOFRAN ODT) 4 MG disintegrating tablet  Every 8 hours PRN     Discontinue  Reprint     04/09/20 0330         [address] Controlled Substance reporting System queried  SEARRA CARNATHAN was evaluated in Emergency Department on 04/09/2020 for the symptoms described in the history of present illness. He/she was evaluated in the context of the global COVID-19 pandemic, which necessitated consideration that the patient might be at risk for infection with the SARS-CoV-2 virus that causes COVID-19. Institutional protocols and algorithms that pertain to the evaluation of patients at risk for COVID-19 are in a state of rapid change based on information released by regulatory bodies including the CDC and federal and state organizations. These policies and algorithms were followed during the patient's care in the ED.    Desmond Lope 04/09/20 1324    Tilden Fossa,  MD 04/09/20 1224

## 2020-04-08 NOTE — ED Triage Notes (Signed)
Pt c/o right abdominal pain that radiates to her right flank, as well as nausea and diarrhea x 5 days. Pt reports symptoms have worsened in the last 2 days.

## 2020-04-09 ENCOUNTER — Emergency Department (HOSPITAL_COMMUNITY): Payer: 59

## 2020-04-09 MED ORDER — IOHEXOL 300 MG/ML  SOLN
100.0000 mL | Freq: Once | INTRAMUSCULAR | Status: AC | PRN
  Administered 2020-04-08: 100 mL via INTRAVENOUS

## 2020-04-09 MED ORDER — ONDANSETRON 4 MG PO TBDP: 4.0000 mg | ORAL_TABLET | Freq: Three times a day (TID) | ORAL | 0 refills | Status: DC | PRN

## 2020-04-09 MED ORDER — HYDROCODONE-ACETAMINOPHEN 5-325 MG PO TABS: 1.0000 | ORAL_TABLET | Freq: Four times a day (QID) | ORAL | 0 refills | Status: DC | PRN

## 2020-04-09 NOTE — Discharge Instructions (Addendum)
You were seen in the emergency department today for abdominal and back pain.  Your labs are overall reassuring.  Your CT scan showed findings of a gallstone, your ultrasound showed that this was not present anymore therefore this may have passed.  Your CT scan did not show any kidney stones.  We suspect your symptoms may have been due to the gallstone.  We are sending you home with the following medicines:  -Norco-this is a narcotic/controlled substance medication that has potential addicting qualities.  We recommend that you take 1-2 tablets every 6 hours as needed for severe pain.  Do not drive or operate heavy machinery when taking this medicine as it can be sedating. Do not drink alcohol or take other sedating medications when taking this medicine for safety reasons.  Keep this out of reach of small children.  Please be aware this medicine has Tylenol in it (325 mg/tab) do not exceed the maximum dose of Tylenol in a day per over the counter recommendations should you decide to supplement with Tylenol over the counter.   - Zofran-this is a medication to take for nausea and vomiting every 8 hours as needed.  You make take Tylenol per over the counter dosing with these medications.   We have prescribed you new medication(s) today. Discuss the medications prescribed today with your pharmacist as they can have adverse effects and interactions with your other medicines including over the counter and prescribed medications. Seek medical evaluation if you start to experience new or abnormal symptoms after taking one of these medicines, seek care immediately if you start to experience difficulty breathing, feeling of your throat closing, facial swelling, or rash as these could be indications of a more serious allergic reaction  Please follow-up with your primary care provider and/or general surgery within 3 to 5 days.  Return to the emergency department for any new or worsening symptoms including but not  limited to return of pain, worsening pain, new pain, inability to keep fluids down, fever, chills, or any other concerns.

## 2020-09-02 ENCOUNTER — Ambulatory Visit: Payer: Self-pay | Admitting: General Surgery

## 2020-09-11 NOTE — Patient Instructions (Addendum)
DUE TO COVID-19 ONLY ONE VISITOR IS ALLOWED TO COME WITH YOU AND STAY IN THE WAITING ROOM ONLY DURING PRE OP AND PROCEDURE.   IF YOU WILL BE ADMITTED INTO THE HOSPITAL YOU ARE ALLOWED ONE SUPPORT PERSON DURING VISITATION HOURS ONLY (10AM -8PM)   . The support person may change daily. . The support person must pass our screening, gel in and out, and wear a mask at all times, including in the patient's room. . Patients must also wear a mask when staff or their support person are in the room.         Your procedure is scheduled on:  Thursday, 09-17-20   Report to [facility] Main  Entrance    Report to admitting at 7:00 AM   Call this number if you have problems the morning of surgery [phone]   Do not eat food :After Midnight.   May have liquids until 6:00 AM day of surgery  CLEAR LIQUID DIET  Foods Allowed                                                                     Foods Excluded  Water, Black Coffee and tea, regular and decaf              liquids that you cannot  Plain Jell-O in any flavor  (No red)                                     see through such as: Fruit ices (not with fruit pulp)                                      milk, soups, orange juice              Iced Popsicles (No red)                                      All solid food                                   Apple juices Sports drinks like Gatorade (No red) Lightly seasoned clear broth or consume(fat free) Sugar, honey syrup   Oral Hygiene is also important to reduce your risk of infection.                                    Remember - BRUSH YOUR TEETH THE MORNING OF SURGERY WITH YOUR REGULAR TOOTHPASTE   Do NOT smoke after Midnight   Take these medicines the morning of surgery with A SIP OF WATER:  None                    How to Manage Your Diabetes Before and After Surgery  Why is it important to control my blood sugar before and after surgery? . Improving  blood sugar levels before and  after surgery helps healing and can limit problems. . A way of improving blood sugar control is eating a healthy diet by: o  Eating less sugar and carbohydrates o  Increasing activity/exercise o  Talking with your doctor about reaching your blood sugar goals . High blood sugars (greater than 180 mg/dL) can raise your risk of infections and slow your recovery, so you will need to focus on controlling your diabetes during the weeks before surgery. . Make sure that the doctor who takes care of your diabetes knows about your planned surgery including the date and location.  How do I manage my blood sugar before surgery? . Check your blood sugar at least 4 times a day, starting 2 days before surgery, to make sure that the level is not too high or low. o Check your blood sugar the morning of your surgery when you wake up and every 2 hours until you get to the Short Stay unit. . If your blood sugar is less than 70 mg/dL, you will need to treat for low blood sugar: o Do not take insulin. o Treat a low blood sugar (less than 70 mg/dL) with  cup of clear juice (cranberry or apple), 4 glucose tablets, OR glucose gel. o Recheck blood sugar in 15 minutes after treatment (to make sure it is greater than 70 mg/dL). If your blood sugar is not greater than 70 mg/dL on recheck, call [phone] for further instructions. . Report your blood sugar to the short stay nurse when you get to Short Stay.  . If you are admitted to the hospital after surgery: o Your blood sugar will be checked by the staff and you will probably be given insulin after surgery (instead of oral diabetes medicines) to make sure you have good blood sugar levels. o The goal for blood sugar control after surgery is 80-180 mg/dL.   WHAT DO I DO ABOUT MY DIABETES MEDICATION?  Marland Kitchen Do not take oral diabetes medicines (pills) the morning of surgery.  . THE DAY BEFORE SURGERY:  Take Metformin as prescribed.       . THE MORNING OF SURGERY:  Do  not take Metformin.   Reviewed and Endorsed by [facility] Patient Education Committee, August 2015              You may not have any metal on your body including hair pins, jewelry, and body piercings             Do not wear make-up, lotions, powders, perfumes/cologne, or deodorant             Do not wear nail polish.  Do not shave  48 hours prior to surgery.         Do not bring valuables to the hospital. [facility] IS NOT  RESPONSIBLE   FOR VALUABLES.   Contacts, dentures or bridgework may not be worn into surgery.     Patients discharged the day of surgery will not be allowed to drive home.              Please read over the following fact sheets you were given: IF YOU HAVE QUESTIONS ABOUT YOUR PRE OP INSTRUCTIONS PLEASE CALL [phone]    - Preparing for Surgery Before surgery, you can play an important role.  Because skin is not sterile, your skin needs to be as free of germs as possible.  You can reduce the number of germs on your skin  by washing with CHG (chlorahexidine gluconate) soap before surgery.  CHG is an antiseptic cleaner which kills germs and bonds with the skin to continue killing germs even after washing. Please DO NOT use if you have an allergy to CHG or antibacterial soaps.  If your skin becomes reddened/irritated stop using the CHG and inform your nurse when you arrive at Short Stay. Do not shave (including legs and underarms) for at least 48 hours prior to the first CHG shower.  You may shave your face/neck.  Please follow these instructions carefully:  1.  Shower with CHG Soap the night before surgery and the  morning of surgery.  2.  If you choose to wash your hair, wash your hair first as usual with your normal  shampoo.  3.  After you shampoo, rinse your hair and body thoroughly to remove the shampoo.                             4.  Use CHG as you would any other liquid soap.  You can apply chg directly to the skin and wash.  Gently with a scrungie  or clean washcloth.  5.  Apply the CHG Soap to your body ONLY FROM THE NECK DOWN.   Do   not use on face/ open                           Wound or open sores. Avoid contact with eyes, ears mouth and   genitals (private parts).                       Wash face,  Genitals (private parts) with your normal soap.             6.  Wash thoroughly, paying special attention to the area where your    surgery  will be performed.  7.  Thoroughly rinse your body with warm water from the neck down.  8.  DO NOT shower/wash with your normal soap after using and rinsing off the CHG Soap.                9.  Pat yourself dry with a clean towel.            10.  Wear clean pajamas.            11.  Place clean sheets on your bed the night of your first shower and do not  sleep with pets. Day of Surgery : Do not apply any lotions/deodorants the morning of surgery.  Please wear clean clothes to the hospital/surgery center.  FAILURE TO FOLLOW THESE INSTRUCTIONS MAY RESULT IN THE CANCELLATION OF YOUR SURGERY  PATIENT SIGNATURE_________________________________  NURSE SIGNATURE__________________________________  ________________________________________________________________________

## 2020-09-11 NOTE — Progress Notes (Addendum)
COVID Vaccine Completed:  No Date COVID Vaccine completed: COVID vaccine manufacturer: Pfizer    Moderna   [employer]   PCP - Nita Sells, MD Cardiologist -   Chest x-ray -  EKG - 09-14-20 in Epic Stress Test -  ECHO -  Cardiac Cath -  Pacemaker/ICD device last checked:  Sleep Study -  CPAP -   Fasting Blood Sugar - 90 to 150 Checks Blood Sugar - two to three times a week  Blood Thinner Instructions: Aspirin Instructions: Last Dose:  Anesthesia review:   Patient denies shortness of breath, fever, cough and chest pain at PAT appointment   Patient verbalized understanding of instructions that were given to them at the PAT appointment. Patient was also instructed that they will need to review over the PAT instructions again at home before surgery.

## 2020-09-14 ENCOUNTER — Encounter (HOSPITAL_COMMUNITY): Payer: Self-pay

## 2020-09-14 ENCOUNTER — Other Ambulatory Visit (HOSPITAL_COMMUNITY)
Admission: RE | Admit: 2020-09-14 | Discharge: 2020-09-14 | Disposition: A | Discharge status: Home / Self Care | Payer: 59 | Source: Ambulatory Visit | Attending: General Surgery | Admitting: General Surgery

## 2020-09-14 ENCOUNTER — Encounter (HOSPITAL_COMMUNITY)
Admission: RE | Admit: 2020-09-14 | Discharge: 2020-09-14 | Disposition: A | Discharge status: Home / Self Care | Payer: 59 | Source: Ambulatory Visit | Attending: General Surgery | Admitting: General Surgery

## 2020-09-14 ENCOUNTER — Other Ambulatory Visit: Payer: Self-pay

## 2020-09-14 DIAGNOSIS — Z01812 Encounter for preprocedural laboratory examination: Secondary | ICD-10-CM | POA: Insufficient documentation

## 2020-09-14 DIAGNOSIS — Z20822 Contact with and (suspected) exposure to covid-19: Secondary | ICD-10-CM | POA: Insufficient documentation

## 2020-09-14 DIAGNOSIS — Z01818 Encounter for other preprocedural examination: Secondary | ICD-10-CM | POA: Insufficient documentation

## 2020-09-14 DIAGNOSIS — I10 Essential (primary) hypertension: Secondary | ICD-10-CM | POA: Insufficient documentation

## 2020-09-14 HISTORY — DX: Other specified postprocedural states: R11.2

## 2020-09-14 HISTORY — DX: Personal history of urinary calculi: Z87.442

## 2020-09-14 HISTORY — DX: Other specified postprocedural states: Z98.890

## 2020-09-14 LAB — CBC
HCT: 40.4 % (ref 36.0–46.0)
Hemoglobin: 12.7 g/dL (ref 12.0–15.0)
MCH: 26.4 pg (ref 26.0–34.0)
MCHC: 31.4 g/dL (ref 30.0–36.0)
MCV: 84 fL (ref 80.0–100.0)
Platelets: 250 10*3/uL (ref 150–400)
RBC: 4.81 MIL/uL (ref 3.87–5.11)
RDW: 13.7 % (ref 11.5–15.5)
WBC: 6.4 10*3/uL (ref 4.0–10.5)
nRBC: 0 % (ref 0.0–0.2)

## 2020-09-14 LAB — BASIC METABOLIC PANEL
Anion gap: 14 (ref 5–15)
BUN: 19 mg/dL (ref 6–20)
CO2: 27 mmol/L (ref 22–32)
Calcium: 9.4 mg/dL (ref 8.9–10.3)
Chloride: 101 mmol/L (ref 98–111)
Creatinine, Ser: 1 mg/dL (ref 0.44–1.00)
GFR, Estimated: >60 mL/min (ref >60)
Glucose, Bld: 103 mg/dL — ABNORMAL HIGH (ref 70–99)
Potassium: 4.3 mmol/L (ref 3.5–5.1)
Sodium: 142 mmol/L (ref 135–145)

## 2020-09-14 LAB — SARS CORONAVIRUS 2 (TAT 6-24 HRS): SARS Coronavirus 2: NEGATIVE

## 2020-09-14 LAB — GLUCOSE, CAPILLARY: Glucose-Capillary: 103 mg/dL — ABNORMAL HIGH (ref 70–99)

## 2020-09-17 ENCOUNTER — Ambulatory Visit (HOSPITAL_COMMUNITY)
Admission: RE | Admit: 2020-09-17 | Discharge: 2020-09-17 | Disposition: A | Discharge status: Home / Self Care | Payer: 59 | Attending: General Surgery | Admitting: General Surgery

## 2020-09-17 ENCOUNTER — Ambulatory Visit (HOSPITAL_COMMUNITY): Payer: 59

## 2020-09-17 ENCOUNTER — Ambulatory Visit (HOSPITAL_COMMUNITY): Payer: 59 | Admitting: Anesthesiology

## 2020-09-17 ENCOUNTER — Encounter (HOSPITAL_COMMUNITY): Payer: Self-pay | Admitting: General Surgery

## 2020-09-17 ENCOUNTER — Encounter (HOSPITAL_COMMUNITY)
Admission: RE | Disposition: A | Discharge status: Home / Self Care | Payer: Self-pay | Source: Home / Self Care | Attending: General Surgery

## 2020-09-17 DIAGNOSIS — Z79899 Other long term (current) drug therapy: Secondary | ICD-10-CM | POA: Insufficient documentation

## 2020-09-17 DIAGNOSIS — K8020 Calculus of gallbladder without cholecystitis without obstruction: Secondary | ICD-10-CM

## 2020-09-17 DIAGNOSIS — E119 Type 2 diabetes mellitus without complications: Secondary | ICD-10-CM | POA: Insufficient documentation

## 2020-09-17 DIAGNOSIS — Z7984 Long term (current) use of oral hypoglycemic drugs: Secondary | ICD-10-CM | POA: Diagnosis not present

## 2020-09-17 DIAGNOSIS — I10 Essential (primary) hypertension: Secondary | ICD-10-CM | POA: Diagnosis not present

## 2020-09-17 DIAGNOSIS — K8010 Calculus of gallbladder with chronic cholecystitis without obstruction: Secondary | ICD-10-CM | POA: Diagnosis not present

## 2020-09-17 HISTORY — PX: CHOLECYSTECTOMY: SHX55

## 2020-09-17 LAB — GLUCOSE, CAPILLARY: Glucose-Capillary: 127 mg/dL — ABNORMAL HIGH (ref 70–99)

## 2020-09-17 SURGERY — LAPAROSCOPIC CHOLECYSTECTOMY WITH INTRAOPERATIVE CHOLANGIOGRAM
Anesthesia: General | Site: Abdomen

## 2020-09-17 MED ORDER — CHLORHEXIDINE GLUCONATE CLOTH 2 % EX PADS: 6.0000 | MEDICATED_PAD | Freq: Once | CUTANEOUS | Status: DC

## 2020-09-17 MED ORDER — DEXAMETHASONE SODIUM PHOSPHATE 10 MG/ML IJ SOLN
INTRAMUSCULAR | Status: AC
  Filled 2020-09-17: qty 1

## 2020-09-17 MED ORDER — DEXAMETHASONE SODIUM PHOSPHATE 10 MG/ML IJ SOLN
INTRAMUSCULAR | Status: DC | PRN
  Administered 2020-09-17: 10 mg via INTRAVENOUS

## 2020-09-17 MED ORDER — OXYCODONE HCL 5 MG/5ML PO SOLN: 5.0000 mg | Freq: Once | ORAL | Status: DC | PRN

## 2020-09-17 MED ORDER — ONDANSETRON HCL 4 MG/2ML IJ SOLN
INTRAMUSCULAR | Status: DC | PRN
  Administered 2020-09-17: 4 mg via INTRAVENOUS

## 2020-09-17 MED ORDER — ORAL CARE MOUTH RINSE: 15.0000 mL | Freq: Once | OROMUCOSAL | Status: AC

## 2020-09-17 MED ORDER — SCOPOLAMINE 1 MG/3DAYS TD PT72
MEDICATED_PATCH | TRANSDERMAL | Status: AC
  Filled 2020-09-17: qty 1

## 2020-09-17 MED ORDER — CELECOXIB 200 MG PO CAPS
200.0000 mg | ORAL_CAPSULE | ORAL | Status: AC
  Administered 2020-09-17: 200 mg via ORAL
  Filled 2020-09-17: qty 1

## 2020-09-17 MED ORDER — LIDOCAINE 2% (20 MG/ML) 5 ML SYRINGE
INTRAMUSCULAR | Status: DC | PRN
  Administered 2020-09-17: 100 mg via INTRAVENOUS

## 2020-09-17 MED ORDER — MIDAZOLAM HCL 2 MG/2ML IJ SOLN
INTRAMUSCULAR | Status: AC
  Filled 2020-09-17: qty 2

## 2020-09-17 MED ORDER — MEPERIDINE HCL 50 MG/ML IJ SOLN: 6.2500 mg | INTRAMUSCULAR | Status: DC | PRN

## 2020-09-17 MED ORDER — ONDANSETRON HCL 4 MG/2ML IJ SOLN: 4.0000 mg | Freq: Once | INTRAMUSCULAR | Status: AC | PRN

## 2020-09-17 MED ORDER — FENTANYL CITRATE (PF) 100 MCG/2ML IJ SOLN
INTRAMUSCULAR | Status: AC
  Administered 2020-09-17: 50 ug via INTRAVENOUS
  Filled 2020-09-17: qty 2

## 2020-09-17 MED ORDER — PROPOFOL 10 MG/ML IV BOLUS
INTRAVENOUS | Status: AC
  Filled 2020-09-17: qty 20

## 2020-09-17 MED ORDER — FENTANYL CITRATE (PF) 100 MCG/2ML IJ SOLN
INTRAMUSCULAR | Status: DC | PRN
  Administered 2020-09-17 (×6): 50 ug via INTRAVENOUS

## 2020-09-17 MED ORDER — ESMOLOL HCL 100 MG/10ML IV SOLN
INTRAVENOUS | Status: AC
  Filled 2020-09-17: qty 10

## 2020-09-17 MED ORDER — ROCURONIUM BROMIDE 10 MG/ML (PF) SYRINGE
PREFILLED_SYRINGE | INTRAVENOUS | Status: AC
  Filled 2020-09-17: qty 10

## 2020-09-17 MED ORDER — CEFAZOLIN SODIUM-DEXTROSE 2-4 GM/100ML-% IV SOLN
2.0000 g | INTRAVENOUS | Status: AC
  Administered 2020-09-17: 2 g via INTRAVENOUS
  Filled 2020-09-17: qty 100

## 2020-09-17 MED ORDER — ACETAMINOPHEN 500 MG PO TABS
1000.0000 mg | ORAL_TABLET | ORAL | Status: AC
  Administered 2020-09-17: 1000 mg via ORAL
  Filled 2020-09-17: qty 2

## 2020-09-17 MED ORDER — ESMOLOL HCL 100 MG/10ML IV SOLN
INTRAVENOUS | Status: DC | PRN
  Administered 2020-09-17 (×2): 15 mg via INTRAVENOUS

## 2020-09-17 MED ORDER — ONDANSETRON HCL 4 MG/2ML IJ SOLN
INTRAMUSCULAR | Status: AC
  Filled 2020-09-17: qty 2

## 2020-09-17 MED ORDER — BUPIVACAINE HCL 0.25 % IJ SOLN
INTRAMUSCULAR | Status: DC | PRN
  Administered 2020-09-17: 29 mL

## 2020-09-17 MED ORDER — FENTANYL CITRATE (PF) 100 MCG/2ML IJ SOLN
INTRAMUSCULAR | Status: AC
  Filled 2020-09-17: qty 2

## 2020-09-17 MED ORDER — LACTATED RINGERS IV SOLN: INTRAVENOUS | Status: DC

## 2020-09-17 MED ORDER — PROPOFOL 10 MG/ML IV BOLUS
INTRAVENOUS | Status: DC | PRN
  Administered 2020-09-17: 150 mg via INTRAVENOUS

## 2020-09-17 MED ORDER — BUPIVACAINE HCL 0.25 % IJ SOLN
INTRAMUSCULAR | Status: AC
  Filled 2020-09-17: qty 1

## 2020-09-17 MED ORDER — PROPOFOL 10 MG/ML IV BOLUS
INTRAVENOUS | Status: AC
  Filled 2020-09-17: qty 40

## 2020-09-17 MED ORDER — LIDOCAINE 2% (20 MG/ML) 5 ML SYRINGE
INTRAMUSCULAR | Status: AC
  Filled 2020-09-17: qty 5

## 2020-09-17 MED ORDER — CHLORHEXIDINE GLUCONATE 0.12 % MT SOLN
15.0000 mL | Freq: Once | OROMUCOSAL | Status: AC
  Administered 2020-09-17: 15 mL via OROMUCOSAL

## 2020-09-17 MED ORDER — ROCURONIUM BROMIDE 10 MG/ML (PF) SYRINGE
PREFILLED_SYRINGE | INTRAVENOUS | Status: DC | PRN
  Administered 2020-09-17: 70 mg via INTRAVENOUS

## 2020-09-17 MED ORDER — FENTANYL CITRATE (PF) 250 MCG/5ML IJ SOLN
INTRAMUSCULAR | Status: AC
  Filled 2020-09-17: qty 5

## 2020-09-17 MED ORDER — MIDAZOLAM HCL 5 MG/5ML IJ SOLN
INTRAMUSCULAR | Status: DC | PRN
  Administered 2020-09-17: 2 mg via INTRAVENOUS

## 2020-09-17 MED ORDER — IOPAMIDOL (ISOVUE-300) INJECTION 61%
INTRAVENOUS | Status: DC | PRN
  Administered 2020-09-17: 7 mL

## 2020-09-17 MED ORDER — ACETAMINOPHEN 160 MG/5ML PO SOLN: 325.0000 mg | ORAL | Status: DC | PRN

## 2020-09-17 MED ORDER — LACTATED RINGERS IV SOLN
INTRAVENOUS | Status: DC | PRN
  Administered 2020-09-17: 1000 mL

## 2020-09-17 MED ORDER — HYDROCODONE-ACETAMINOPHEN 5-325 MG PO TABS: 1.0000 | ORAL_TABLET | Freq: Four times a day (QID) | ORAL | 0 refills | Status: DC | PRN

## 2020-09-17 MED ORDER — ONDANSETRON HCL 4 MG/2ML IJ SOLN
INTRAMUSCULAR | Status: AC
  Administered 2020-09-17: 4 mg via INTRAVENOUS
  Filled 2020-09-17: qty 2

## 2020-09-17 MED ORDER — FENTANYL CITRATE (PF) 100 MCG/2ML IJ SOLN
25.0000 ug | INTRAMUSCULAR | Status: DC | PRN
  Administered 2020-09-17: 50 ug via INTRAVENOUS

## 2020-09-17 MED ORDER — GABAPENTIN 300 MG PO CAPS
300.0000 mg | ORAL_CAPSULE | ORAL | Status: AC
  Administered 2020-09-17: 300 mg via ORAL
  Filled 2020-09-17: qty 1

## 2020-09-17 MED ORDER — OXYCODONE HCL 5 MG PO TABS: 5.0000 mg | ORAL_TABLET | Freq: Once | ORAL | Status: DC | PRN

## 2020-09-17 MED ORDER — SCOPOLAMINE 1 MG/3DAYS TD PT72
MEDICATED_PATCH | TRANSDERMAL | Status: DC | PRN
  Administered 2020-09-17: 1 via TRANSDERMAL

## 2020-09-17 MED ORDER — ACETAMINOPHEN 325 MG PO TABS: 325.0000 mg | ORAL_TABLET | ORAL | Status: DC | PRN

## 2020-09-17 SURGICAL SUPPLY — 34 items
ADH SKN CLS APL DERMABOND .7 (GAUZE/BANDAGES/DRESSINGS) ×1
APL PRP STRL LF DISP 70% ISPRP (MISCELLANEOUS) ×1
APPLIER CLIP 5 13 M/L LIGAMAX5 (MISCELLANEOUS) ×3
APR CLP MED LRG 5 ANG JAW (MISCELLANEOUS) ×1
BAG SPEC RTRVL LRG 6X4 10 (ENDOMECHANICALS) ×1
CABLE HIGH FREQUENCY MONO STRZ (ELECTRODE) ×3 IMPLANT
CATH REDDICK CHOLANGI 4FR 50CM (CATHETERS) ×3 IMPLANT
CHLORAPREP W/TINT 26 (MISCELLANEOUS) ×3 IMPLANT
CLIP APPLIE 5 13 M/L LIGAMAX5 (MISCELLANEOUS) ×1 IMPLANT
COVER MAYO STAND STRL (DRAPES) ×3 IMPLANT
COVER WAND RF STERILE (DRAPES) ×2 IMPLANT
DECANTER SPIKE VIAL GLASS SM (MISCELLANEOUS) ×3 IMPLANT
DERMABOND ADVANCED (GAUZE/BANDAGES/DRESSINGS) ×2
DERMABOND ADVANCED .7 DNX12 (GAUZE/BANDAGES/DRESSINGS) ×1 IMPLANT
DRAPE C-ARM 42X120 X-RAY (DRAPES) ×3 IMPLANT
ELECT REM PT RETURN 15FT ADLT (MISCELLANEOUS) ×3 IMPLANT
GLOVE BIO SURGEON STRL SZ7.5 (GLOVE) ×3 IMPLANT
GOWN STRL REUS W/TWL XL LVL3 (GOWN DISPOSABLE) ×9 IMPLANT
HEMOSTAT SURGICEL 4X8 (HEMOSTASIS) IMPLANT
IV CATH 14GX2 1/4 (CATHETERS) ×3 IMPLANT
KIT BASIN OR (CUSTOM PROCEDURE TRAY) ×3 IMPLANT
KIT TURNOVER KIT A (KITS) IMPLANT
PENCIL SMOKE EVACUATOR (MISCELLANEOUS) IMPLANT
POUCH SPECIMEN RETRIEVAL 10MM (ENDOMECHANICALS) ×3 IMPLANT
SCISSORS LAP 5X35 DISP (ENDOMECHANICALS) ×3 IMPLANT
SET IRRIG TUBING LAPAROSCOPIC (IRRIGATION / IRRIGATOR) ×3 IMPLANT
SET TUBE SMOKE EVAC HIGH FLOW (TUBING) ×3 IMPLANT
SLEEVE XCEL OPT CAN 5 100 (ENDOMECHANICALS) ×6 IMPLANT
SUT MNCRL AB 4-0 PS2 18 (SUTURE) ×3 IMPLANT
TOWEL OR 17X26 10 PK STRL BLUE (TOWEL DISPOSABLE) ×3 IMPLANT
TOWEL OR NON WOVEN STRL DISP B (DISPOSABLE) ×3 IMPLANT
TRAY LAPAROSCOPIC (CUSTOM PROCEDURE TRAY) ×3 IMPLANT
TROCAR BLADELESS OPT 5 100 (ENDOMECHANICALS) ×3 IMPLANT
TROCAR XCEL BLUNT TIP 100MML (ENDOMECHANICALS) ×3 IMPLANT

## 2020-09-17 NOTE — Discharge Instructions (Signed)
Laparoscopic Cholecystectomy, Care After This sheet gives you information about how to care for yourself after your procedure. Your doctor may also give you more specific instructions. If you have problems or questions, contact your doctor. Follow these instructions at home: Care for cuts from surgery (incisions)   Follow instructions from your doctor about how to take care of your cuts from surgery. Make sure you: ? You have skin glue (dermabond) over your incisions. The is no dressings to change.  Do not take baths, swim, or use a hot tub until your doctor says it is okay. You may shower on 09/19/20. Do not soak in dirty water.  Check your surgical cut area every day for signs of infection. Check for: ? More redness, swelling, or pain. ? More fluid or blood. ? Warmth. ? Pus or a bad smell. Activity  Do not drive or use heavy machinery while taking prescription pain medicine.  Do not lift anything that is heavier than 10 lb (4.5 kg) until your doctor says it is okay.  Do not play contact sports until your doctor says it is okay.  Do not drive for 24 hours if you were given a medicine to help you relax (sedative).  Rest as needed. Do not return to work or school until your doctor says it is okay. General instructions  Take over-the-counter and prescription medicines only as told by your doctor.  To prevent or treat constipation while you are taking prescription pain medicine, your doctor may recommend that you: ? Drink enough fluid to keep your pee (urine) clear or pale yellow. ? Take over-the-counter or prescription medicines. ? Eat foods that are high in fiber, such as fresh fruits and vegetables, whole grains, and beans. ? Limit foods that are high in fat and processed sugars, such as fried and sweet foods. Contact a doctor if:  You develop a rash.  You have more redness, swelling, or pain around your surgical cuts.  You have more fluid or blood coming from  your surgical cuts.  Your surgical cuts feel warm to the touch.  You have pus or a bad smell coming from your surgical cuts.  You have a fever.  One or more of your surgical cuts breaks open. Get help right away if:  You have trouble breathing.  You have chest pain.  You have pain that is getting worse in your shoulders.  You faint or feel dizzy when you stand.  You have very bad pain in your belly (abdomen).  You are sick to your stomach (nauseous) for more than one day.  You have throwing up (vomiting) that lasts for more than one day.  You have leg pain. This information is not intended to replace advice given to you by your health care provider. Make sure you discuss any questions you have with your health care provider. Document Revised: 09/29/2017 Document Reviewed: 04/04/2016 Elsevier Patient Education  2020 [employer]     General Anesthesia, Adult, Care After This sheet gives you information about how to care for yourself after your procedure. Your health care provider may also give you more specific instructions. If you have problems or questions, contact your health care provider. What can I expect after the procedure? After the procedure, the following side effects are common:  Pain or discomfort at the IV site.  Nausea.  Vomiting.  Sore throat.  Trouble concentrating.  Feeling cold or chills.  Weak or tired.  Sleepiness and fatigue.  Soreness and body aches. These side effects can affect parts of the body that were not involved in surgery. Follow these instructions at home:  For at least 24 hours after the procedure:  Have a responsible adult stay with you. It is important to have someone help care for you until you are awake and alert.  Rest as needed.  Do not: ? Participate in activities in which you could fall or become injured. ? Drive. ? Use heavy machinery. ? Drink alcohol. ? Take sleeping pills or medicines that cause  drowsiness. ? Make important decisions or sign legal documents. ? Take care of children on your own. Eating and drinking  Follow any instructions from your health care provider about eating or drinking restrictions.  When you feel hungry, start by eating small amounts of foods that are soft and easy to digest (bland), such as toast. Gradually return to your regular diet.  Drink enough fluid to keep your urine pale yellow.  If you vomit, rehydrate by drinking water, juice, or clear broth. General instructions  If you have sleep apnea, surgery and certain medicines can increase your risk for breathing problems. Follow instructions from your health care provider about wearing your sleep device: ? Anytime you are sleeping, including during daytime naps. ? While taking prescription pain medicines, sleeping medicines, or medicines that make you drowsy.  Return to your normal activities as told by your health care provider. Ask your health care provider what activities are safe for you.  Take over-the-counter and prescription medicines only as told by your health care provider.  If you smoke, do not smoke without supervision.  Keep all follow-up visits as told by your health care provider. This is important. Contact a health care provider if:  You have nausea or vomiting that does not get better with medicine.  You cannot eat or drink without vomiting.  You have pain that does not get better with medicine.  You are unable to pass urine.  You develop a skin rash.  You have a fever.  You have redness around your IV site that gets worse. Get help right away if:  You have difficulty breathing.  You have chest pain.  You have blood in your urine or stool, or you vomit blood. Summary  After the procedure, it is common to have a sore throat or nausea. It is also common to feel tired.  Have a responsible adult stay with you for the first 24 hours after general anesthesia. It is  important to have someone help care for you until you are awake and alert.  When you feel hungry, start by eating small amounts of foods that are soft and easy to digest (bland), such as toast. Gradually return to your regular diet.  Drink enough fluid to keep your urine pale yellow.  Return to your normal activities as told by your health care provider. Ask your health care provider what activities are safe for you. This information is not intended to replace advice given to you by your health care provider. Make sure you discuss any questions you have with your health care provider. Document Revised: 10/20/2017 Document Reviewed: 06/02/2017 Elsevier Patient Education  2020 [employer]. .

## 2020-09-17 NOTE — Anesthesia Procedure Notes (Signed)
Procedure Name: Intubation Date/Time: 09/17/2020 9:30 AM Performed by: Silas Sacramento, CRNA Pre-anesthesia Checklist: Patient identified, Emergency Drugs available, Suction available and Patient being monitored Patient Re-evaluated:Patient Re-evaluated prior to induction Oxygen Delivery Method: Circle system utilized Preoxygenation: Pre-oxygenation with 100% oxygen Induction Type: IV induction Ventilation: Mask ventilation without difficulty and Oral airway inserted - appropriate to patient size Laryngoscope Size: Mac and 4 Grade View: Grade I Tube type: Oral Tube size: 7.0 mm Number of attempts: 1 Airway Equipment and Method: Stylet and Oral airway Placement Confirmation: ETT inserted through vocal cords under direct vision,  positive ETCO2 and breath sounds checked- equal and bilateral Secured at: 21 cm Tube secured with: Tape Dental Injury: Teeth and Oropharynx as per pre-operative assessment

## 2020-09-17 NOTE — Transfer of Care (Signed)
Immediate Anesthesia Transfer of Care Note  Patient: Tina Thompson  Procedure(s) Performed: LAPAROSCOPIC CHOLECYSTECTOMY WITH INTRAOPERATIVE CHOLANGIOGRAM (N/A Abdomen)  Patient Location: PACU  Anesthesia Type:General  Level of Consciousness: awake, oriented, patient cooperative and responds to stimulation  Airway & Oxygen Therapy: Patient Spontanous Breathing and Patient connected to face mask oxygen  Post-op Assessment: Report given to RN and Post -op Vital signs reviewed and stable  Post vital signs: Reviewed and stable  Last Vitals:  Vitals Value Taken Time  BP 167/71 09/17/20 1115  Temp 36.7 C 09/17/20 1054  Pulse 83 09/17/20 1116  Resp 18 09/17/20 1116  SpO2 100 % 09/17/20 1116  Vitals shown include unvalidated device data.  Last Pain:  Vitals:   09/17/20 1100  TempSrc:   PainSc: 7          Complications: No complications documented.

## 2020-09-17 NOTE — Anesthesia Preprocedure Evaluation (Signed)
Anesthesia Evaluation  Patient identified by MRN, date of birth, ID band Patient awake    Reviewed: Allergy & Precautions, NPO status , Patient's Chart, lab work & pertinent test results  History of Anesthesia Complications (+) PONV and history of anesthetic complications  Airway Mallampati: I       Dental no notable dental hx.    Pulmonary neg pulmonary ROS,    Pulmonary exam normal        Cardiovascular hypertension, Pt. on medications Normal cardiovascular exam     Neuro/Psych negative neurological ROS  negative psych ROS   GI/Hepatic negative GI ROS, Neg liver ROS,   Endo/Other  diabetes, Oral Hypoglycemic Agents  Renal/GU   negative genitourinary   Musculoskeletal negative musculoskeletal ROS (+)   Abdominal Normal abdominal exam  (+)   Peds  Hematology   Anesthesia Other Findings   Reproductive/Obstetrics                             Anesthesia Physical Anesthesia Plan  ASA: II  Anesthesia Plan: General   Post-op Pain Management:    Induction:   PONV Risk Score and Plan: 4 or greater and Ondansetron, Dexamethasone, Midazolam and Scopolamine patch - Pre-op  Airway Management Planned: Oral ETT  Additional Equipment: None  Intra-op Plan:   Post-operative Plan: Extubation in OR  Informed Consent: I have reviewed the patients History and Physical, chart, labs and discussed the procedure including the risks, benefits and alternatives for the proposed anesthesia with the patient or authorized representative who has indicated his/her understanding and acceptance.     Dental advisory given  Plan Discussed with: CRNA  Anesthesia Plan Comments:         Anesthesia Quick Evaluation

## 2020-09-17 NOTE — Interval H&P Note (Signed)
History and Physical Interval Note:  09/17/2020 8:52 AM  Tina Thompson  has presented today for surgery, with the diagnosis of gallstones.  The various methods of treatment have been discussed with the patient and family. After consideration of risks, benefits and other options for treatment, the patient has consented to  Procedure(s): LAPAROSCOPIC CHOLECYSTECTOMY WITH INTRAOPERATIVE CHOLANGIOGRAM (N/A) as a surgical intervention.  The patient's history has been reviewed, patient examined, no change in status, stable for surgery.  I have reviewed the patient's chart and labs.  Questions were answered to the patient's satisfaction.     Chevis Pretty III

## 2020-09-17 NOTE — H&P (Signed)
Tina Thompson  Location: [facility] Patient #: [id number] DOB: 01/16/1961 Married / Language: English / Race: White Female   History of Present Illness  The patient is a 59 year old female who presents with abdominal pain. We are asked to see the patient in consultation by Dr. Nita Sells to evaluate her for gallstones. The patient is a 59 year old white female who is been experiencing right upper quadrant pain since April of this year. She went to the emergency department where a CT scan did show a small stone near the neck of the gallbladder. There was no evidence of inflammation. Her liver functions were normal. She has continued to have a constant dull ache in the right upper quadrant with some occasional sharp pains that she would classify as severe. She has had some nausea associated with it. Otherwise she has a past medical history diabetes and hypertension. She does not smoke.   Past Surgical History  Anal Fissure Repair  Breast Biopsy  Left. Cesarean Section - 1  Hemorrhoidectomy   Diagnostic Studies History  Colonoscopy  5-10 years ago Mammogram  within last year Pap Smear  1-5 years ago  Allergies No Known Drug Allergies   Medication History Ramipril (5MG  Capsule, Oral) Active. metFORMIN HCl (1000MG  Tablet, Oral) Active. Medications Reconciled  Social History Caffeine use  Carbonated beverages, Coffee. No alcohol use  No drug use  Tobacco use  Never smoker.  Family History  Diabetes Mellitus  Brother, Mother, Sister. Migraine Headache  Sister, Son. Ovarian Cancer  Sister. Respiratory Condition  Father. Thyroid problems  Mother.  Pregnancy / Birth History Age of menopause  56-55 Gravida  4 Maternal age  35-20 Para  3  Other Problems Back Pain  Cholelithiasis  Diabetes Mellitus  Hemorrhoids  High blood pressure  Kidney Stone     Review of Systems General Present- Appetite Loss. Not Present- Chills,  Fatigue, Fever, Night Sweats, Weight Gain and Weight Loss. Skin Not Present- Change in Wart/Mole, Dryness, Hives, Jaundice, New Lesions, Non-Healing Wounds, Rash and Ulcer. HEENT Present- Wears glasses/contact lenses. Not Present- Earache, Hearing Loss, Hoarseness, Nose Bleed, Oral Ulcers, Ringing in the Ears, Seasonal Allergies, Sinus Pain, Sore Throat, Visual Disturbances and Yellow Eyes. Respiratory Not Present- Bloody sputum, Chronic Cough, Difficulty Breathing, Snoring and Wheezing. Breast Not Present- Breast Mass, Breast Pain, Nipple Discharge and Skin Changes. Cardiovascular Not Present- Chest Pain, Difficulty Breathing Lying Down, Leg Cramps, Palpitations, Rapid Heart Rate, Shortness of Breath and Swelling of Extremities. Gastrointestinal Present- Nausea. Not Present- Abdominal Pain, Bloating, Bloody Stool, Change in Bowel Habits, Chronic diarrhea, Constipation, Difficulty Swallowing, Excessive gas, Gets full quickly at meals, Hemorrhoids, Indigestion, Rectal Pain and Vomiting. Female Genitourinary Present- Urgency. Not Present- Frequency, Nocturia, Painful Urination and Pelvic Pain. Musculoskeletal Present- Back Pain. Not Present- Joint Pain, Joint Stiffness, Muscle Pain, Muscle Weakness and Swelling of Extremities. Neurological Not Present- Decreased Memory, Fainting, Headaches, Numbness, Seizures, Tingling, Tremor, Trouble walking and Weakness. Psychiatric Not Present- Anxiety, Bipolar, Change in Sleep Pattern, Depression, Fearful and Frequent crying. Endocrine Not Present- Cold Intolerance, Excessive Hunger, Hair Changes, Heat Intolerance, Hot flashes and New Diabetes. Hematology Not Present- Blood Thinners, Easy Bruising, Excessive bleeding, Gland problems, HIV and Persistent Infections.  Vitals  Weight: 186 lb Height: 65in Body Surface Area: 1.92 m Body Mass Index: 30.95 kg/m  Temp.: 78F  Pulse: 114 (Regular)  P.OX: 99% (Room air) BP: 116/68(Sitting, Left Arm,  Standard)       Physical Exam  General Mental Status-Alert. General Appearance-Consistent with  stated age. Hydration-Well hydrated. Voice-Normal.  Head and Neck Head-normocephalic, atraumatic with no lesions or palpable masses. Trachea-midline. Thyroid Gland Characteristics - normal size and consistency.  Eye Eyeball - Bilateral-Extraocular movements intact. Sclera/Conjunctiva - Bilateral-No scleral icterus.  Chest and Lung Exam Chest and lung exam reveals -quiet, even and easy respiratory effort with no use of accessory muscles and on auscultation, normal breath sounds, no adventitious sounds and normal vocal resonance. Inspection Chest Wall - Normal. Back - normal.  Cardiovascular Cardiovascular examination reveals -normal heart sounds, regular rate and rhythm with no murmurs and normal pedal pulses bilaterally.  Abdomen Inspection Inspection of the abdomen reveals - No Hernias. Skin - Scar - no surgical scars. Palpation/Percussion Palpation and Percussion of the abdomen reveal - Soft, Non Tender, No Rebound tenderness, No Rigidity (guarding) and No hepatosplenomegaly. Auscultation Auscultation of the abdomen reveals - Bowel sounds normal.  Neurologic Neurologic evaluation reveals -alert and oriented x 3 with no impairment of recent or remote memory. Mental Status-Normal.  Musculoskeletal Normal Exam - Left-Upper Extremity Strength Normal and Lower Extremity Strength Normal. Normal Exam - Right-Upper Extremity Strength Normal and Lower Extremity Strength Normal.  Lymphatic Head & Neck  General Head & Neck Lymphatics: Bilateral - Description - Normal. Axillary  General Axillary Region: Bilateral - Description - Normal. Tenderness - Non Tender. Femoral & Inguinal  Generalized Femoral & Inguinal Lymphatics: Bilateral - Description - Normal. Tenderness - Non Tender.    Assessment & Plan  GALLSTONES (K80.20) Impression: The  patient appears to have symptomatic gallstones. Because of the risk of further painful episodes and possible pancreatitis that she would benefit from having her gallbladder removed. She would also like to have this done. I have discussed with her in detail the risks and benefits of the operation to remove the gallbladder as well as some of the technical aspects including the risk of common duct injury and she understands and wishes to proceed. This patient encounter took 30 minutes today to perform the following: take history, perform exam, review outside records, interpret imaging, counsel the patient on their diagnosis and document encounter, findings & plan in the EHR

## 2020-09-17 NOTE — Op Note (Signed)
09/17/2020  10:41 AM  PATIENT:  Tina Thompson  59 y.o. female  PRE-OPERATIVE DIAGNOSIS:  gallstones  POST-OPERATIVE DIAGNOSIS:  gallstones  PROCEDURE:  Procedure(s): LAPAROSCOPIC CHOLECYSTECTOMY WITH INTRAOPERATIVE CHOLANGIOGRAM (N/A)  SURGEON:  Surgeon(s) and Role:    * Griselda Miner, MD - Primary  PHYSICIAN ASSISTANT:   ASSISTANTS: none   ANESTHESIA:   local and general  EBL:  10 mL   BLOOD ADMINISTERED:none  DRAINS: none   LOCAL MEDICATIONS USED:  MARCAINE     SPECIMEN:  Source of Specimen:  gallbladder  DISPOSITION OF SPECIMEN:  PATHOLOGY  COUNTS:  YES  TOURNIQUET:  * No tourniquets in log *  DICTATION: .Dragon Dictation     Procedure: After informed consent was obtained the patient was brought to the operating room and placed in the supine position on the operating room table. After adequate induction of general anesthesia the patient's abdomen was prepped with ChloraPrep allowed to dry and draped in usual sterile manner. An appropriate timeout was performed. The area below the umbilicus was infiltrated with quarter percent  Marcaine. A small incision was made with a 15 blade knife. The incision was carried down through the subcutaneous tissue bluntly with a hemostat and [employer] retractors. The linea alba was identified. The linea alba was incised with a 15 blade knife and each side was grasped with Coker clamps. The preperitoneal space was then probed with a hemostat until the peritoneum was opened and access was gained to the abdominal cavity. A 0 Vicryl pursestring stitch was placed in the fascia surrounding the opening. A Hassan cannula was then placed through the opening and anchored in place with the previously placed Vicryl purse string stitch. The abdomen was insufflated with carbon dioxide without difficulty. A laparoscope was inserted through the [facility] cannula in the right upper quadrant was inspected. Next the epigastric region was infiltrated with %  Marcaine. A small incision was made with a 15 blade knife. A 5 mm port was placed bluntly through this incision into the abdominal cavity under direct vision. Next 2 sites were chosen laterally on the right side of the abdomen for placement of 5 mm ports. Each of these areas was infiltrated with quarter percent Marcaine. Small stab incisions were made with a 15 blade knife. 5 mm ports were then placed bluntly through these incisions into the abdominal cavity under direct vision without difficulty. A blunt grasper was placed through the lateralmost 5 mm port and used to grasp the dome of the gallbladder and elevated anteriorly and superiorly. Another blunt grasper was placed through the other 5 mm port and used to retract the body and neck of the gallbladder. A dissector was placed through the epigastric port and using the electrocautery the peritoneal reflection at the gallbladder neck was opened. Blunt dissection was then carried out in this area until the gallbladder neck-cystic duct junction was readily identified and a good window was created. A single clip was placed on the gallbladder neck. A small  ductotomy was made just below the clip with laparoscopic scissors. A 14-gauge Angiocath was then placed through the anterior abdominal wall under direct vision. A Reddick cholangiogram catheter was then placed through the Angiocath and flushed. The catheter was then placed in the cystic duct and anchored in place with a clip. A cholangiogram was obtained that showed no filling defects good emptying into the duodenum an adequate length on the cystic duct. The anchoring clip and catheters were then removed from the patient. 3 clips  were placed proximally on the cystic duct and the duct was divided between the 2 sets of clips. Posterior to this the cystic artery was identified and again dissected bluntly in a circumferential manner until a good window  was created. 2 clips were placed proximally and one distally on  the artery and the artery was divided between the 2 sets of clips. Next a laparoscopic hook cautery device was used to separate the gallbladder from the liver bed. Prior to completely detaching the gallbladder from the liver bed the liver bed was inspected and several small bleeding points were coagulated with the electrocautery until the area was completely hemostatic. The gallbladder was then detached the rest of it from the liver bed without difficulty. A laparoscopic bag was inserted through the hassan port. The laparoscope was moved to the epigastric port. The gallbladder was placed within the bag and the bag was sealed.  The bag with the gallbladder was then removed with the [facility] cannula through the infraumbilical port without difficulty. The fascial defect was then closed with the previously placed Vicryl pursestring stitch as well as with another figure-of-eight 0 Vicryl stitch. The liver bed was inspected again and found to be hemostatic. The abdomen was irrigated with copious amounts of saline until the effluent was clear. The ports were then removed under direct vision without difficulty and were found to be hemostatic. The gas was allowed to escape. The skin incisions were all closed with interrupted 4-0 Monocryl subcuticular stitches. Dermabond dressings were applied. The patient tolerated the procedure well. At the end of the case all needle sponge and instrument counts were correct. The patient was then awakened and taken to recovery in stable condition  PLAN OF CARE: Discharge to home after PACU  PATIENT DISPOSITION:  PACU - hemodynamically stable.   Delay start of Pharmacological VTE agent (>24hrs) due to surgical blood loss or risk of bleeding: not applicable

## 2020-09-18 ENCOUNTER — Encounter (HOSPITAL_COMMUNITY): Payer: Self-pay | Admitting: General Surgery

## 2020-09-18 LAB — SURGICAL PATHOLOGY

## 2020-09-23 NOTE — Anesthesia Postprocedure Evaluation (Signed)
Anesthesia Post Note  Patient: Tina Thompson  Procedure(s) Performed: LAPAROSCOPIC CHOLECYSTECTOMY WITH INTRAOPERATIVE CHOLANGIOGRAM (N/A Abdomen)     Patient location during evaluation: PACU Anesthesia Type: General Level of consciousness: awake and sedated Pain management: pain level controlled Vital Signs Assessment: post-procedure vital signs reviewed and stable Respiratory status: spontaneous breathing Cardiovascular status: stable Postop Assessment: no headache Anesthetic complications: yes (PONV)   No complications documented.  Last Vitals:  Vitals:   09/17/20 1136 09/17/20 1241  BP: (!) 162/79 (!) 154/72  Pulse: 77 67  Resp: 18   Temp:  36.8 C  SpO2: 99% 97%    Last Pain:  Vitals:   09/17/20 1241  TempSrc: Oral  PainSc: 4                  John F Lindsie Simar Jr

## 2022-05-31 ENCOUNTER — Other Ambulatory Visit: Payer: Self-pay | Admitting: Obstetrics and Gynecology

## 2022-05-31 DIAGNOSIS — Z8249 Family history of ischemic heart disease and other diseases of the circulatory system: Secondary | ICD-10-CM

## 2022-06-29 ENCOUNTER — Ambulatory Visit (INDEPENDENT_AMBULATORY_CARE_PROVIDER_SITE_OTHER): Payer: 59 | Admitting: Podiatry

## 2022-06-29 ENCOUNTER — Ambulatory Visit (INDEPENDENT_AMBULATORY_CARE_PROVIDER_SITE_OTHER): Payer: 59

## 2022-06-29 DIAGNOSIS — M2041 Other hammer toe(s) (acquired), right foot: Secondary | ICD-10-CM | POA: Diagnosis not present

## 2022-06-29 DIAGNOSIS — M2042 Other hammer toe(s) (acquired), left foot: Secondary | ICD-10-CM | POA: Diagnosis not present

## 2022-06-29 DIAGNOSIS — M722 Plantar fascial fibromatosis: Secondary | ICD-10-CM

## 2022-06-29 MED ORDER — TRIAMCINOLONE ACETONIDE 10 MG/ML IJ SUSP
10.0000 mg | Freq: Once | INTRAMUSCULAR | Status: AC
  Administered 2022-06-29: 10 mg

## 2022-06-30 NOTE — Progress Notes (Signed)
Subjective:   Patient ID: Tina Thompson, female   DOB: 61 y.o.   MRN: [id number]   HPI Patient presents with a nodule of the left arch of approximate 6 months duration.  States that she gets pain at times with it and cramping and feels like it may have grown recently.  Patient does not smoke tries to be active   Review of Systems  All other systems reviewed and are negative.       Objective:  Physical Exam Vitals and nursing note reviewed.  Constitutional:      Appearance: She is well-developed.  Pulmonary:     Effort: Pulmonary effort is normal.  Musculoskeletal:        General: Normal range of motion.  Skin:    General: Skin is warm.  Neurological:     Mental Status: She is alert.     Neurovascular status was found to be intact muscle strength was found to be adequate within the left mid arch area there is a nodule measuring 1.3 cm x 1.3 cm which appears to be within the plantar fascia itself and is moderately tender when pressed.  There is some movement of it and it does seem to shrink and increase in size at times with good digital perfusion well oriented.  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  I went ahead did sterile prep and injected the area around the cyst not directly into it to try to shrink it with 3 mg Dexasone Kenalog 5 mg Xylocaine advised on heat and also we will start her on topical verapamil to try to see if we can shrink the tissue.  I discussed  the toes and consideration of distal arthroplasty exostectomy could be done and I gave her pads today and trim the digit on the right fifth toe  X-rays were negative for signs of calcification or other bony issue appears to be soft tissue

## 2022-08-12 ENCOUNTER — Other Ambulatory Visit: Payer: 59

## 2022-08-16 ENCOUNTER — Ambulatory Visit
Admission: RE | Admit: 2022-08-16 | Discharge: 2022-08-16 | Disposition: A | Discharge status: Home / Self Care | Payer: No Typology Code available for payment source | Source: Ambulatory Visit | Attending: Obstetrics and Gynecology | Admitting: Obstetrics and Gynecology

## 2022-08-16 ENCOUNTER — Other Ambulatory Visit: Payer: 59

## 2022-08-16 DIAGNOSIS — Z8249 Family history of ischemic heart disease and other diseases of the circulatory system: Secondary | ICD-10-CM

## 2022-08-31 ENCOUNTER — Ambulatory Visit (INDEPENDENT_AMBULATORY_CARE_PROVIDER_SITE_OTHER): Payer: 59 | Admitting: Podiatry

## 2022-08-31 ENCOUNTER — Encounter: Payer: Self-pay | Admitting: Podiatry

## 2022-08-31 DIAGNOSIS — M2041 Other hammer toe(s) (acquired), right foot: Secondary | ICD-10-CM | POA: Diagnosis not present

## 2022-08-31 DIAGNOSIS — M2042 Other hammer toe(s) (acquired), left foot: Secondary | ICD-10-CM | POA: Diagnosis not present

## 2022-08-31 DIAGNOSIS — M722 Plantar fascial fibromatosis: Secondary | ICD-10-CM | POA: Diagnosis not present

## 2022-08-31 NOTE — Progress Notes (Signed)
Subjective:   Patient ID: Tina Thompson, female   DOB: 61 y.o.   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

## 2023-06-15 ENCOUNTER — Other Ambulatory Visit: Payer: Self-pay | Admitting: Obstetrics and Gynecology

## 2023-06-15 DIAGNOSIS — E041 Nontoxic single thyroid nodule: Secondary | ICD-10-CM

## 2023-06-27 ENCOUNTER — Ambulatory Visit
Admission: RE | Admit: 2023-06-27 | Discharge: 2023-06-27 | Disposition: A | Discharge status: Home / Self Care | Payer: 59 | Source: Ambulatory Visit | Attending: Obstetrics and Gynecology | Admitting: Obstetrics and Gynecology

## 2023-06-27 DIAGNOSIS — E041 Nontoxic single thyroid nodule: Secondary | ICD-10-CM

## 2024-05-05 NOTE — Progress Notes (Signed)
 Chief Complaint: Right flank pain, history of stone  History of Present Illness:  Tina Thompson is a 63 y.o. female sent by Dr. Shona in [place] for follow-up of history of urolithiasis.  She had a large volume of right renal calculi treated by Dr. Enrigue here as at [facility] in approximately 2013.  This required to percutaneous treatments as well as ureteroscopy.  She did have most of the stone being composed of uric acid.  She had a CT scan done at the time of a gallbladder attack 4 years ago.  This did not show any renal calculi.  Over the past several months she has had right flank pain.  Not exacerbated or improved by movement.  No gross hematuria.  No left-sided pain.   Past Medical History:  Past Medical History:  Diagnosis Date   Anemia    Chronic kidney disease    kidney stones   Diabetes mellitus    History of kidney stones    Hypertension    PONV (postoperative nausea and vomiting)     Past Surgical History:  Past Surgical History:  Procedure Laterality Date   CESAREAN SECTION     CHOLECYSTECTOMY N/A 09/17/2020   Procedure: LAPAROSCOPIC CHOLECYSTECTOMY WITH INTRAOPERATIVE CHOLANGIOGRAM;  Surgeon: Curvin Mt III, MD;  Location: [facility];  Service: General;  Laterality: N/A;   CYSTOSCOPY     w/ right kidney stone removed   ECTOPIC PREGNANCY SURGERY     w/ removal of left tube   EXTRACORPOREAL SHOCK WAVE LITHOTRIPSY  07/18/2012   Procedure: EXTRACORPOREAL SHOCK WAVE LITHOTRIPSY (ESWL);  Surgeon: Mohammad I Javaid, MD;  Location: [facility];  Service: Urology;  Laterality: Right;  ESWL Right Renal Calculus   hemorhoidectomy     NEPHROLITHOTOMY      Allergies:  No Known Allergies  Family History:  No family history on file.  Social History:  Social History   Tobacco Use   Smoking status: Never   Smokeless tobacco: Never  Vaping Use   Vaping status: Never Used  Substance Use Topics   Alcohol use: No   Drug use: No    Review of symptoms:   Constitutional:  Negative for unexplained weight loss, night sweats, fever, chills ENT:  Negative for nose bleeds, sinus pain, painful swallowing CV:  Negative for chest pain, shortness of breath, exercise intolerance, palpitations, loss of consciousness Resp:  Negative for cough, wheezing, shortness of breath GI:  Negative for nausea, vomiting, diarrhea, bloody stools GU:  Positives noted in HPI; otherwise negative for gross hematuria, dysuria, urinary incontinence Neuro:  Negative for seizures, poor balance, limb weakness, slurred speech Psych:  Negative for lack of energy, depression, anxiety Endocrine:  Negative for polydipsia, polyuria, symptoms of hypoglycemia (dizziness, hunger, sweating) Hematologic:  Negative for anemia, purpura, petechia, prolonged or excessive bleeding, use of anticoagulants  Allergic:  Negative for difficulty breathing or choking as a result of exposure to anything; no shellfish allergy; no allergic response (rash/itch) to materials, foods  Physical exam: LMP 07/05/2012  GENERAL APPEARANCE:  Well appearing, well developed, well nourished, NAD HEENT: Atraumatic, Normocephalic. NECK: Normal appearance LUNGS: Normal inspiratory and expiratory excursion HEART: Regular Rate ABDOMEN: Minimal right CVA tenderness EXTREMITIES: Moves all extremities well.  Without clubbing, cyanosis, or edema. NEUROLOGIC:  Alert and oriented x 3, normal gait, CN II-XII grossly intact.  MENTAL STATUS:  Appropriate. SKIN:  Warm, dry and intact.    Results: No results found for this or any previous visit (from the  past 24 hours).  I have reviewed prior patient's records--Dr. Milford records, [facility] urology records  I have reviewed urinalysis--clear today  I reviewed prior imaging studies--CT A/P 6.9.21- duct/gallbladder neck without significant gallbladder distention, pericholecystic fluid or inflammation. If there is clinical concern for acute  cholecystitis, consider further evaluation with right upper quadrant ultrasound. 2. Previously seen right renal calculi, no longer evident. Bilateral areas of cortical scarring. No urolithiasis or obstructive nephropathy. 3. 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  Assessment: History of uric acid calculi, requiring significant intervention about 12 years ago.  CT scan 4 years ago showed no obvious stone but the patient is symptomatic with right flank pain   Plan: I will order CT stone protocol, we will call with results and follow-up

## 2024-05-06 ENCOUNTER — Ambulatory Visit (INDEPENDENT_AMBULATORY_CARE_PROVIDER_SITE_OTHER): Admitting: Urology

## 2024-05-06 ENCOUNTER — Encounter: Payer: Self-pay | Admitting: Urology

## 2024-05-06 VITALS — BP 136/91 | HR 123 | Ht 65.0 in | Wt 156.0 lb

## 2024-05-06 DIAGNOSIS — R109 Unspecified abdominal pain: Secondary | ICD-10-CM | POA: Diagnosis not present

## 2024-05-06 DIAGNOSIS — Z87442 Personal history of urinary calculi: Secondary | ICD-10-CM | POA: Diagnosis not present

## 2024-05-06 LAB — URINALYSIS, ROUTINE W REFLEX MICROSCOPIC
Bilirubin, UA: NEGATIVE
Glucose, UA: NEGATIVE
Leukocytes,UA: NEGATIVE
Nitrite, UA: NEGATIVE
RBC, UA: NEGATIVE
Specific Gravity, UA: >=1.03 (ref 1.005–1.030)
Urobilinogen, Ur: 0.2 mg/dL (ref 0.2–1.0)
pH, UA: 5.5 (ref 5.0–7.5)

## 2024-05-06 LAB — MICROSCOPIC EXAMINATION

## 2024-05-21 ENCOUNTER — Ambulatory Visit: Admitting: Urology

## 2024-05-23 ENCOUNTER — Telehealth: Payer: Self-pay | Admitting: Urology

## 2024-05-23 ENCOUNTER — Telehealth: Payer: Self-pay

## 2024-05-23 MED ORDER — OXYBUTYNIN CHLORIDE 5 MG PO TABS: 5.0000 mg | ORAL_TABLET | Freq: Three times a day (TID) | ORAL | 0 refills | Status: DC | PRN

## 2024-05-23 MED ORDER — OXYBUTYNIN CHLORIDE 5 MG PO TABS: 5.0000 mg | ORAL_TABLET | Freq: Three times a day (TID) | ORAL | 0 refills | Status: AC | PRN

## 2024-05-23 NOTE — Telephone Encounter (Signed)
 Please see other encounter.

## 2024-05-23 NOTE — Telephone Encounter (Signed)
 Returned call to patient. Pharmacy changed.

## 2024-05-23 NOTE — Telephone Encounter (Signed)
 Patient called stating she thinks stone has moved into her bladder and is causing her to have bladder spasms.  Ct ordered but has not been done. Patient asked if imaging is still needed if stone is in bladder.  Patient request medication to help with bladder spasms and would like provider to confirm if ct is still needed before its scheduled.

## 2024-05-23 NOTE — Telephone Encounter (Signed)
 Patient called with no answer. Message left regarding need for imaging and medication sent to pharmacy.

## 2024-05-23 NOTE — Telephone Encounter (Signed)
 Send medication to [facility] in [place] , her insurance no longer covers [place], she would also like you to call her back.   [facility])

## 2024-06-19 ENCOUNTER — Ambulatory Visit (HOSPITAL_COMMUNITY)
Admission: RE | Admit: 2024-06-19 | Discharge: 2024-06-19 | Disposition: A | Discharge status: Home / Self Care | Source: Ambulatory Visit | Attending: Urology | Admitting: Urology

## 2024-06-19 DIAGNOSIS — Z87442 Personal history of urinary calculi: Secondary | ICD-10-CM | POA: Diagnosis present

## 2024-06-19 DIAGNOSIS — R109 Unspecified abdominal pain: Secondary | ICD-10-CM | POA: Diagnosis present

## 2024-06-26 ENCOUNTER — Ambulatory Visit (HOSPITAL_COMMUNITY)

## 2024-06-27 ENCOUNTER — Ambulatory Visit: Payer: Self-pay
